# Patient Record
Sex: Male | Born: 1970 | Race: White | Hispanic: No | Marital: Single | Smoking: Never smoker
Health system: Southern US, Community
[De-identification: ages and names within clinical notes are randomized; demographics above are authoritative.]

## PROBLEM LIST (undated history)

## (undated) DIAGNOSIS — F429 Obsessive-compulsive disorder, unspecified: Secondary | ICD-10-CM

## (undated) DIAGNOSIS — R12 Heartburn: Secondary | ICD-10-CM

---

## 2014-12-04 ENCOUNTER — Emergency Department (HOSPITAL_COMMUNITY)
Admission: EM | Admit: 2014-12-04 | Discharge: 2014-12-05 | Disposition: A | Discharge status: Home / Self Care | Payer: 59 | Attending: Emergency Medicine | Admitting: Emergency Medicine

## 2014-12-04 ENCOUNTER — Encounter (HOSPITAL_COMMUNITY): Payer: Self-pay | Admitting: Emergency Medicine

## 2014-12-04 DIAGNOSIS — S80212A Abrasion, left knee, initial encounter: Secondary | ICD-10-CM | POA: Insufficient documentation

## 2014-12-04 DIAGNOSIS — S52102A Unspecified fracture of upper end of left radius, initial encounter for closed fracture: Secondary | ICD-10-CM | POA: Insufficient documentation

## 2014-12-04 DIAGNOSIS — Y9351 Activity, roller skating (inline) and skateboarding: Secondary | ICD-10-CM | POA: Diagnosis not present

## 2014-12-04 DIAGNOSIS — S59902A Unspecified injury of left elbow, initial encounter: Secondary | ICD-10-CM | POA: Diagnosis present

## 2014-12-04 DIAGNOSIS — Y998 Other external cause status: Secondary | ICD-10-CM | POA: Insufficient documentation

## 2014-12-04 DIAGNOSIS — Z8659 Personal history of other mental and behavioral disorders: Secondary | ICD-10-CM | POA: Insufficient documentation

## 2014-12-04 DIAGNOSIS — Y9289 Other specified places as the place of occurrence of the external cause: Secondary | ICD-10-CM | POA: Diagnosis not present

## 2014-12-04 DIAGNOSIS — S4990XA Unspecified injury of shoulder and upper arm, unspecified arm, initial encounter: Secondary | ICD-10-CM

## 2014-12-04 DIAGNOSIS — S52132A Displaced fracture of neck of left radius, initial encounter for closed fracture: Secondary | ICD-10-CM

## 2014-12-04 DIAGNOSIS — T148XXA Other injury of unspecified body region, initial encounter: Secondary | ICD-10-CM

## 2014-12-04 HISTORY — DX: Heartburn: R12

## 2014-12-04 HISTORY — DX: Obsessive-compulsive disorder, unspecified: F42.9

## 2014-12-04 NOTE — ED Notes (Signed)
The patient said he was riding a [employer] and he fell on his arm left arm.  He also fell on his right knee.  He says irt does not hurt if he is not moving it.  He rates his pain 8/10 if he moves his arm.  He does have an abrasion to the right knee.  He is here to be evaluated.

## 2014-12-04 NOTE — ED Provider Notes (Signed)
CSN: [id number]     Arrival date & time 12/04/14  2308 History  This chart was scribed for non-physician practitioner, Arman Filter, NP, working with Samuel Jester, DO, by Roxy Cedar ED Scribe. This patient was seen in room A07C/A07C and the patient's care was started at 11:56 PM    Chief Complaint  Patient presents with  . Arm Injury    The patinet said he was riding a skate board and he fell on his arm left arm.  He also fell on his right knee.  He says irt does not hurt if he is not moving it.   Patient is a 44 y.o. male presenting with arm injury. The history is provided by the patient. No language interpreter was used.  Arm Injury  HPI Comments: Jared Cannon is a 44 y.o. male with a PMHx of OCD, who presents to the Emergency Department complaining of moderate left arm pain due to fall while skateboarding about 3 hours ago. He states that he lost his balance and tried catching his fall with his left arm. His pain is worst at left elbow and radiates to forearm. He reports increased pain with rotation of elbow. He denies LOC or head impact. He also reports associated abrasion to right knee. Patient is unsure of most recent tetanus shot. No medications taken PTA. Patient drove himself to ED. Patient has no known medical allergies.  Past Medical History  Diagnosis Date  . OCD (obsessive compulsive disorder)   . Heartburn    History reviewed. No pertinent past surgical history. History reviewed. No pertinent family history. History  Substance Use Topics  . Smoking status: Never Smoker   . Smokeless tobacco: Never Used  . Alcohol Use: Yes     Comment: occ   Review of Systems  Musculoskeletal: Positive for joint swelling and arthralgias (left elbow). Myalgias: left forearm.  Skin: Positive for wound (abrasion to left knee).  Neurological: Negative for weakness and numbness.  All other systems reviewed and are negative.  Allergies  Review of patient's allergies indicates no  known allergies.  Home Medications   Prior to Admission medications   Medication Sig Start Date End Date Taking? Authorizing Provider  oxyCODONE-acetaminophen (PERCOCET/ROXICET) 5-325 MG per tablet Take 1-2 tablets by mouth every 6 (six) hours as needed for severe pain. 12/05/14   Earley Favor, NP   Triage Vitals: BP 146/86 mmHg  Pulse 73  Temp(Src) 98.2 F (36.8 C) (Oral)  Resp 16  Ht 5\' 10"  (1.778 m)  Wt 200 lb (90.719 kg)  BMI 28.70 kg/m2  SpO2 100%  Physical Exam  Constitutional: He is oriented to person, place, and time. He appears well-developed and well-nourished. No distress.  HENT:  Head: Normocephalic and atraumatic.  Eyes: Conjunctivae and EOM are normal.  Neck: Neck supple. No tracheal deviation present.  Cardiovascular: Normal rate.   Pulmonary/Chest: Effort normal. No respiratory distress.  Musculoskeletal: Normal range of motion. He exhibits tenderness.  TTP to left elbow  Neurological: He is alert and oriented to person, place, and time.  Skin: Skin is warm and dry.  Psychiatric: He has a normal mood and affect. His behavior is normal.  Nursing note and vitals reviewed.  ED Course  Procedures (including critical care time)  DIAGNOSTIC STUDIES: Oxygen Saturation is 100% on RA, normal by my interpretation.    COORDINATION OF CARE: 12:05 AM- Discussed plans to order diagnostic imaging of left elbow. Will give patient medication for pain management. Pt advised of plan for  treatment and pt agrees.  Labs Review Labs Reviewed - No data to display  Imaging Review Dg Elbow Complete Left  12/05/2014   CLINICAL DATA:  Skateboard accident, fall.  EXAM: LEFT ELBOW - COMPLETE 3+ VIEW  COMPARISON:  None.  FINDINGS: There is no evidence of fracture, dislocation, or joint effusion. Slight cortical irregularity of the radial head neck junction seen on a single view. There is no evidence of arthropathy or other focal bone abnormality. Small posterior joint effusion.   IMPRESSION: Slight cortical irregularity of the radial head neck junction and small elbow effusion suspicious for acute injury. Recommend follow-up radiograph in 1 week.   Electronically Signed   By: Awilda Metro M.D.   On: 12/05/2014 03:31   Dg Wrist Complete Left  12/05/2014   CLINICAL DATA:  Skateboard accident, fall.  EXAM: LEFT WRIST - COMPLETE 3+ VIEW  COMPARISON:  None.  FINDINGS: There is no evidence of fracture or dislocation. There is no evidence of arthropathy or other focal bone abnormality. Soft tissues are unremarkable.  IMPRESSION: Negative.   Electronically Signed   By: Awilda Metro M.D.   On: 12/05/2014 03:32     EKG Interpretation None    placed in sling and given ortho follow up and Rx for Percocet  MDM   Final diagnoses:  Abrasion  Radial neck fracture, left, closed, initial encounter   I personally performed the services described in this documentation, which was scribed in my presence. The recorded information has been reviewed and is accurate.  Earley Favor, NP 12/05/14 0341  Samuel Jester, DO 12/08/14 1427

## 2014-12-05 ENCOUNTER — Emergency Department (HOSPITAL_COMMUNITY): Payer: 59

## 2014-12-05 MED ORDER — IBUPROFEN 200 MG PO TABS
600.0000 mg | ORAL_TABLET | Freq: Once | ORAL | Status: DC
  Filled 2014-12-05: qty 3

## 2014-12-05 MED ORDER — HYDROCODONE-ACETAMINOPHEN 5-325 MG PO TABS
1.0000 | ORAL_TABLET | Freq: Once | ORAL | Status: AC
  Administered 2014-12-05: 1 via ORAL
  Filled 2014-12-05: qty 1

## 2014-12-05 MED ORDER — OXYCODONE-ACETAMINOPHEN 5-325 MG PO TABS: 1.0000 | ORAL_TABLET | Freq: Four times a day (QID) | ORAL | Status: DC | PRN

## 2014-12-05 MED ORDER — MORPHINE SULFATE 4 MG/ML IJ SOLN
4.0000 mg | Freq: Once | INTRAMUSCULAR | Status: AC
  Administered 2014-12-05: 4 mg via INTRAMUSCULAR
  Filled 2014-12-05: qty 1

## 2014-12-05 NOTE — ED Notes (Signed)
Patient transported to X-ray 

## 2014-12-05 NOTE — ED Notes (Signed)
Xray called to say pt unable to tolerate xray, returned to room

## 2014-12-05 NOTE — ED Notes (Signed)
Jared Cannon from xray notified that pt is ready for transport.

## 2014-12-05 NOTE — Discharge Instructions (Signed)
Wear the sling at alltimes except bathing until seen by orthopedics The xray show an irregularity at the radial head consistent with an acute fracture but furthe rxrays are recommended in 1 week

## 2015-10-22 IMAGING — DX DG ELBOW COMPLETE 3+V*L*
3 series · 3 of 3 positions shown · non-contrast
Comparison: None.

CLINICAL DATA: Skateboard accident, fall.

EXAM:
LEFT ELBOW - COMPLETE 3+ VIEW

[elbow ap (1 of 2)]
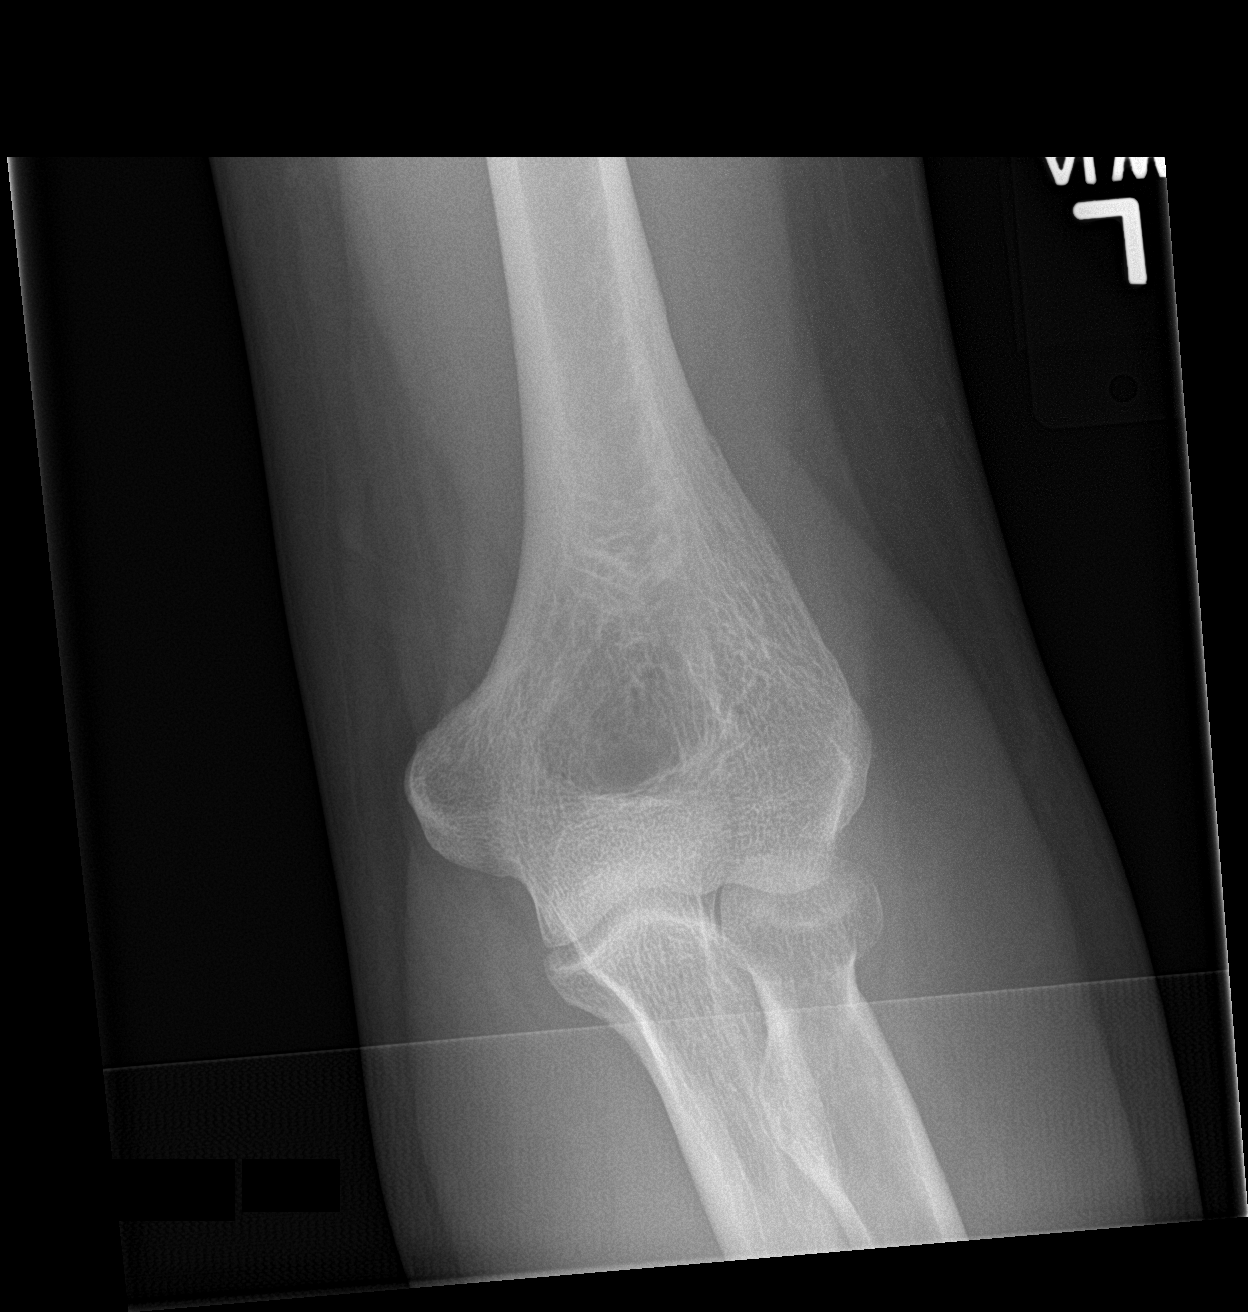

[elbow lat]
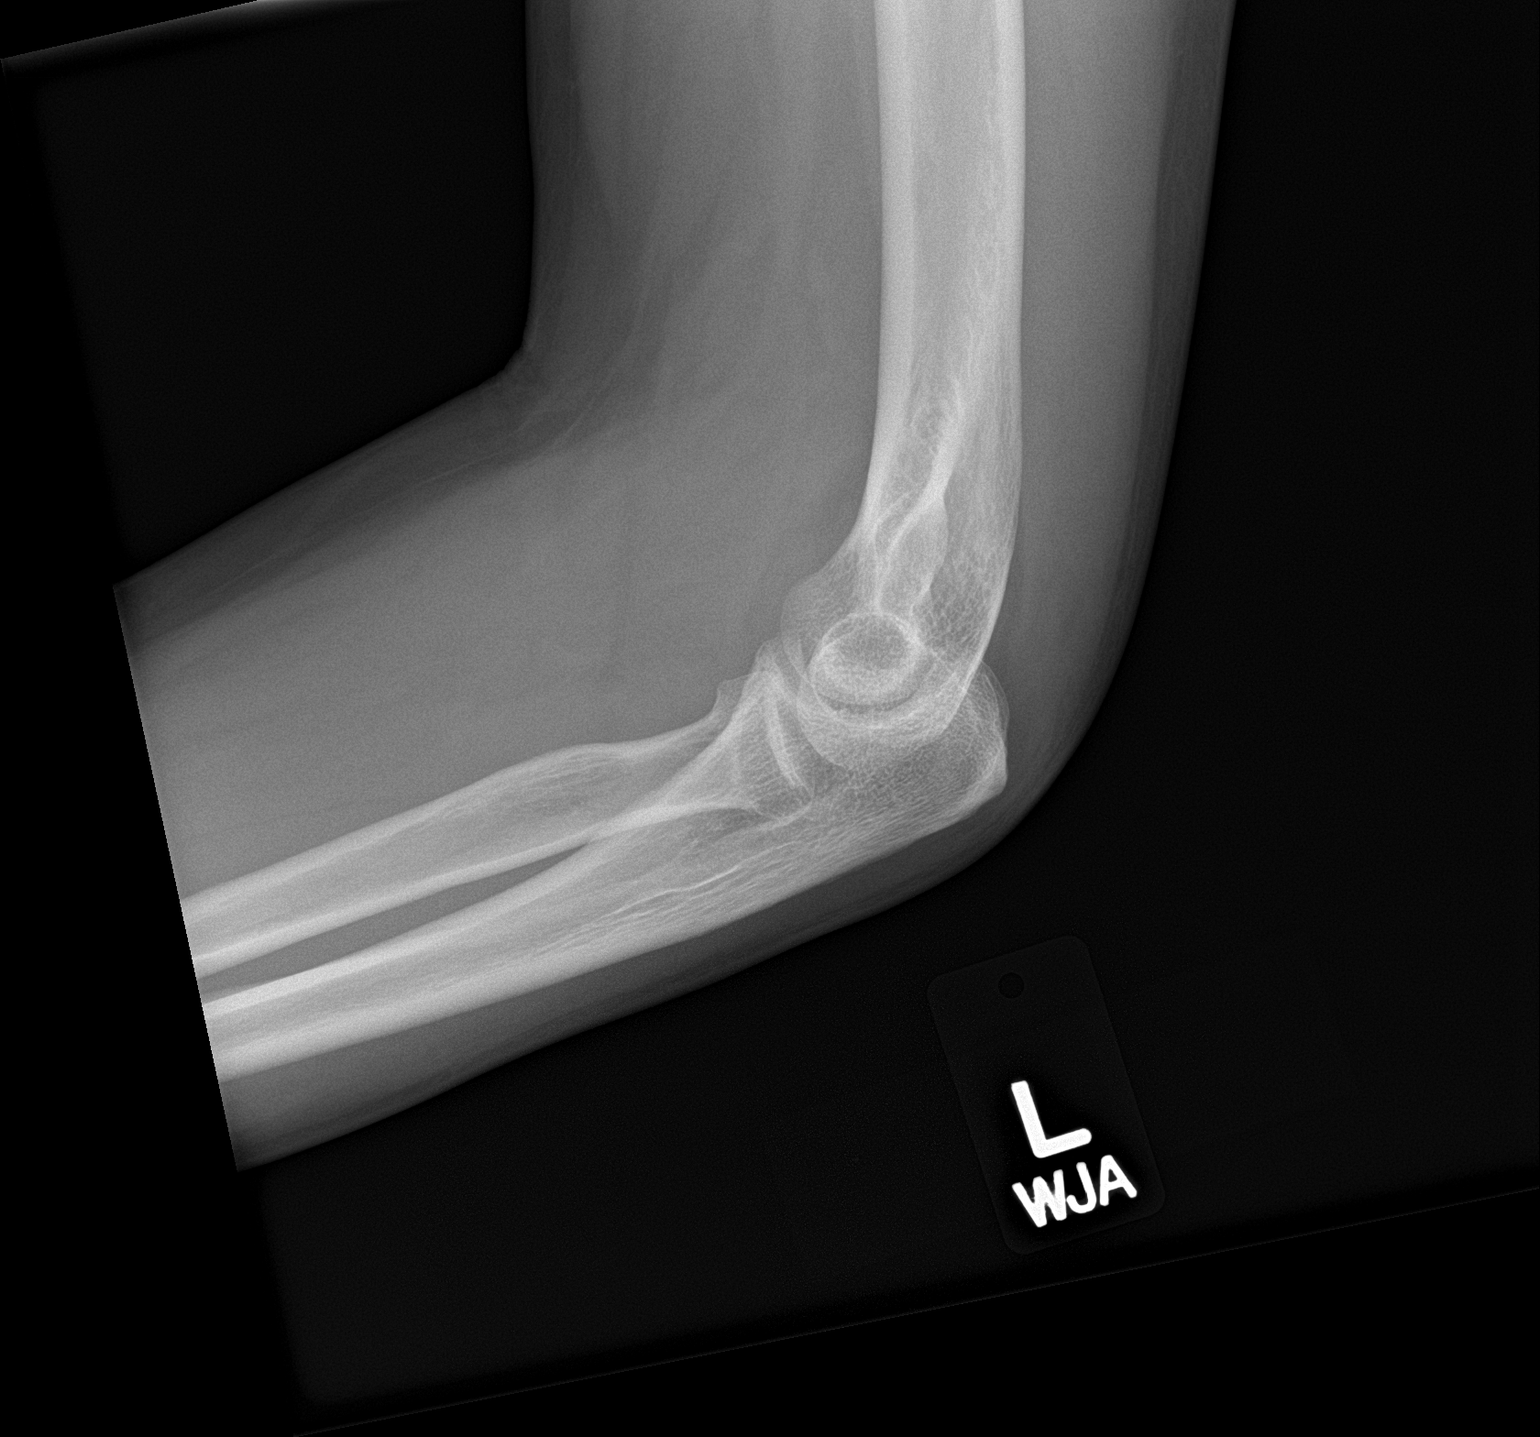

[elbow ap (2 of 2)]
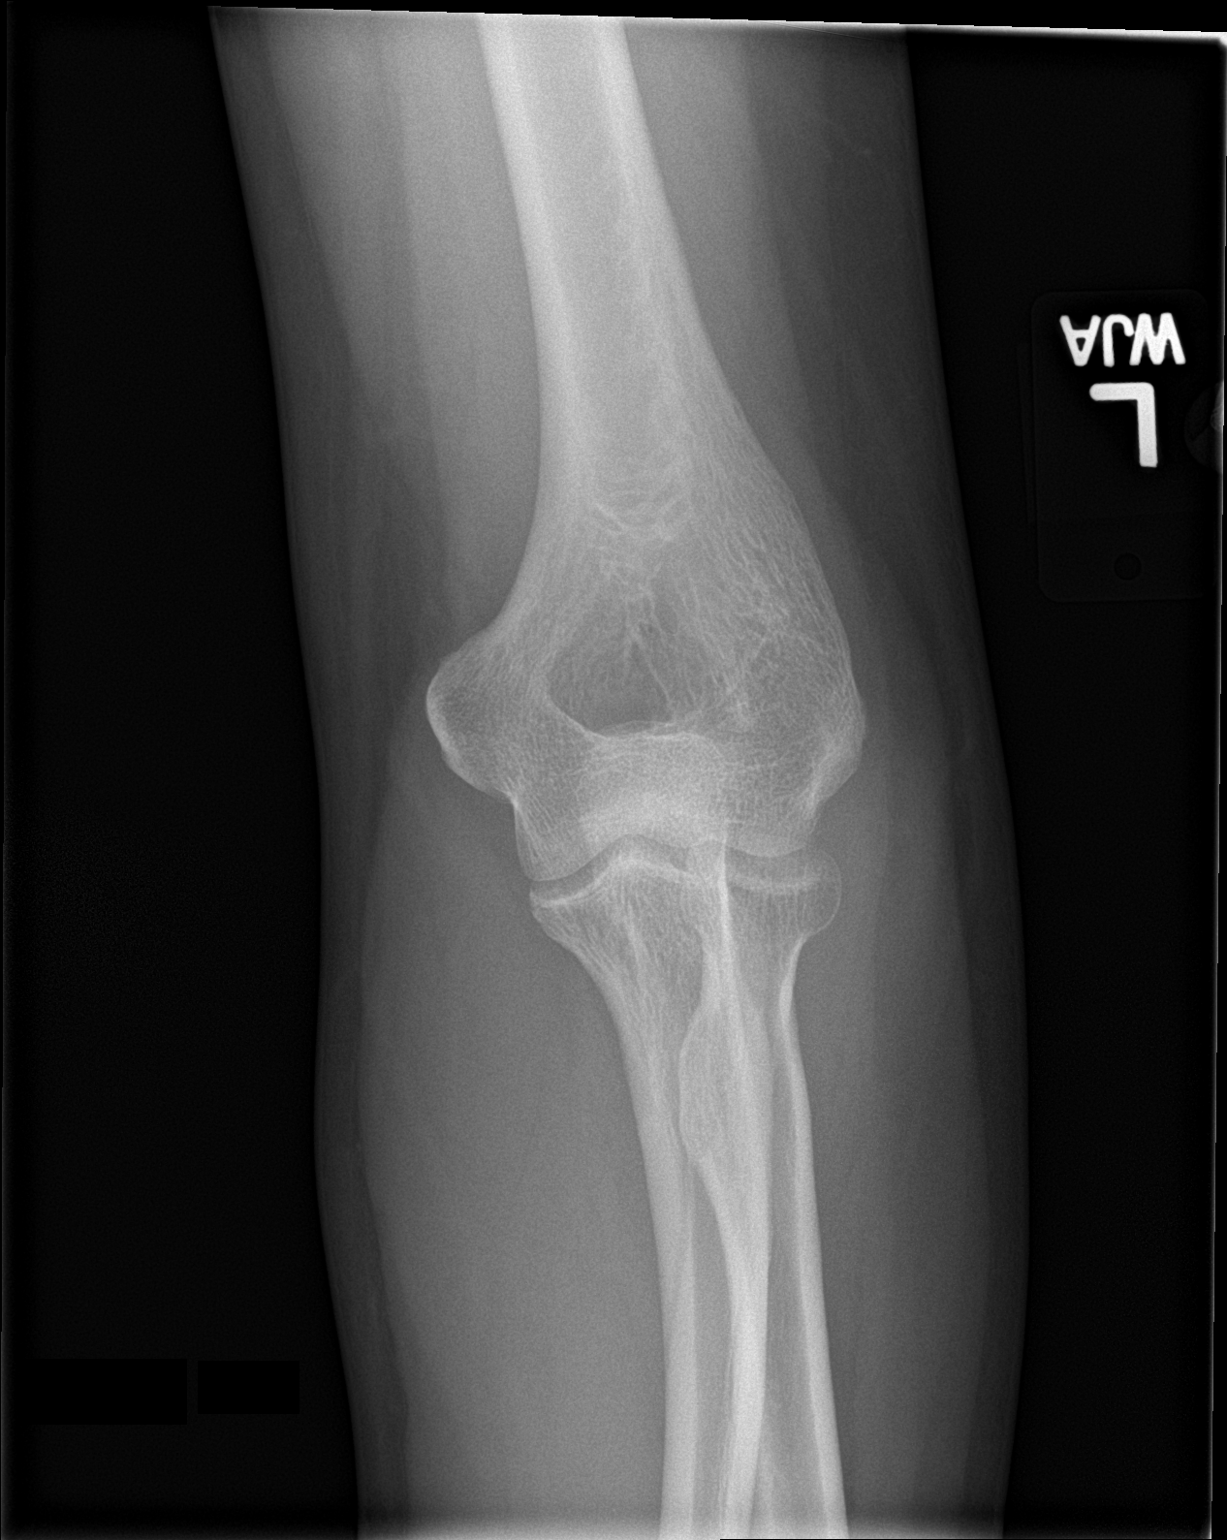

[3 of 3 positions shown; findings below may reference images not displayed]

FINDINGS: There is no evidence of fracture, dislocation, or joint effusion.
Slight cortical irregularity of the radial head neck junction seen
on a single view. There is no evidence of arthropathy or other focal
bone abnormality. Small posterior joint effusion.
IMPRESSION: Slight cortical irregularity of the radial head neck junction and
small elbow effusion suspicious for acute injury. Recommend
follow-up radiograph in 1 week.

## 2020-07-07 ENCOUNTER — Ambulatory Visit (HOSPITAL_COMMUNITY)
Admission: RE | Admit: 2020-07-07 | Discharge: 2020-07-07 | Disposition: A | Discharge status: Home / Self Care | Payer: 59 | Attending: Psychiatry | Admitting: Psychiatry

## 2020-07-07 DIAGNOSIS — F419 Anxiety disorder, unspecified: Secondary | ICD-10-CM | POA: Diagnosis not present

## 2020-07-07 DIAGNOSIS — F341 Dysthymic disorder: Secondary | ICD-10-CM | POA: Insufficient documentation

## 2020-07-07 DIAGNOSIS — R450 Nervousness: Secondary | ICD-10-CM | POA: Diagnosis not present

## 2020-07-07 DIAGNOSIS — F429 Obsessive-compulsive disorder, unspecified: Secondary | ICD-10-CM | POA: Insufficient documentation

## 2020-07-07 NOTE — BH Assessment (Signed)
Pt consented for clinician to contact his mother Jeanene Erb, [phone]) to gather additional information. Pt's mother reported, the pt has been belligerent, scary lately she thinks it's his medications. Pt's mother reported, is more angry. Pt's mother reported, pt said he's going to kill himself but she does not have any concerns of suicide. Pt's mother also denies concerns of HI, self-injurious behaviors and access to weapons with the pt. Pt's mother reported, she feel the pt will be safe at home.    Redmond Pulling, MS, [facility], [facility] Triage Specialist [phone]

## 2020-07-07 NOTE — BH Assessment (Signed)
Comprehensive Clinical Assessment (CCA) Note  07/07/2020 Jared Cannon [id number]   Jared Cannon is a 50 year old male who presents voluntary and unaccompanied to [facility]. Clinician asked the pt, "what brought you to the hospital?" Pt reported, last night he made him a cup of coffee, he mother asked him to do several things, he got up set and pushed his cup, sugar and another object in the sink, causing him to cup a chip. Pt reported, his mother called his brother and asked if she called the police, while in front of the pt. Per pt, his brother told their mother she should call the police, the police were not called. Pt reported, they(he and his mother) got in a very defensive argument. Pt reported, he's been living with his mother for four years. Pt reported, he doesn't want to stay with his mother, he's looked into getting an apartment but if he wants to go to [facility] he will need to stay. Pt reported, he has been on a LOA from work for 2-3 months due to his depression and OCD. Pt reported, he went back to work for 4-5 days but went back on short term leave. Pt discussed rituals and obsessions associated with his OCD. Pt reported, he's been sleeping more to cope. Pt reported, previous passive suicidal thoughts. Pt reported, two weeks ago he told his psychiatrist he was suicidal with a plan. Pt reported, he has plans, things he wants to do and he does not want to kill himself. Pt also denies, SI, HI, AVH, and access to weapons.   Pt denies, substance use. Pt is linked to Dr. Donell Beers for medication management and to [facility] for therapy. Pt reported, previous inpatient admission in [place], [place]. Pt reported, he is linked to Intensive Outpatient Therapy Program with [facility] by his psychiatrist but was waiting until he get his new insurance. Pt is planning to go [facility] in  for his OCD.   Pt presents alert with normal speech. Pt's mood, affect is anxious. Pt's  thought content was appropriate to mood and circumstances. Pt reported, if discharged from [facility] he can contract for safety.   Disposition: Jared Sequin, NP recommends the pt does not meet inpatient treatment criteria, pt to follow up with Intensive Outpatient Treatment Program. Pt to follow up with Dr. Donell Beers (psychiatrist).   Diagnosis: OCD  Chief Complaint:  Chief Complaint  Patient presents with  . Psychiatric Evaluation   Visit Diagnosis:     CCA Screening, Triage and Referral (STR)  Patient Reported Information How did you hear about [place]? Self  Referral name: No data recorded Referral phone number: No data recorded  Whom do you see for routine medical problems? No data recorded Practice/Facility Name: No data recorded Practice/Facility Phone Number: No data recorded Name of Contact: No data recorded Contact Number: No data recorded Contact Fax Number: No data recorded Prescriber Name: No data recorded Prescriber Address (if known): No data recorded  What Is the Reason for Your Visit/Call Today? No data recorded How Long Has This Been Causing You Problems? > than 6 months  What Do You Feel Would Help You the Most Today? No data recorded  Have You Recently Been in Any Inpatient Treatment (Hospital/Detox/Crisis Center/28-Day Program)? No  Name/Location of Program/Hospital:No data recorded How Long Were You There? No data recorded When Were You Discharged? No data recorded  Have You Ever Received Services From [facility] Before? Yes  Who Do You See  at [facility]? Dr. Donell Beers, psychiatrist.   Have You Recently Had Any Thoughts About Hurting Yourself? No  Are You Planning to Commit Suicide/Harm Yourself At This time? No   Have you Recently Had Thoughts About Hurting Someone Karolee Ohs? No  Explanation: No data recorded  Have You Used Any Alcohol or Drugs in the Past 24 Hours? No  How Long Ago Did You Use Drugs or Alcohol? No data recorded What Did You Use and  How Much? No data recorded  Do You Currently Have a Therapist/Psychiatrist? No  Name of Therapist/Psychiatrist: No data recorded  Have You Been Recently Discharged From Any Office Practice or Programs? No data recorded Explanation of Discharge From Practice/Program: No data recorded    CCA Screening Triage Referral Assessment Type of Contact: Face-to-Face  Is this Initial or Reassessment? No data recorded Date Telepsych consult ordered in [facility]:  No data recorded Time Telepsych consult ordered in [facility]:  No data recorded  Patient Reported Information Reviewed? Yes  Patient Left Without Being Seen? No data recorded Reason for Not Completing Assessment: No data recorded  Collateral Involvement: Bonne Dolores, mother, [phone]   Does Patient Have a Court Appointed Legal Guardian? No data recorded Name and Contact of Legal Guardian: No data recorded If Minor and Not Living with Parent(s), Who has Custody? No data recorded Is CPS involved or ever been involved? No data recorded Is APS involved or ever been involved? No data recorded  Patient Determined To Be At Risk for Harm To Self or Others Based on Review of Patient Reported Information or Presenting Complaint? No  Method: No data recorded Availability of Means: No data recorded Intent: No data recorded Notification Required: No data recorded Additional Information for Danger to Others Potential: No data recorded Additional Comments for Danger to Others Potential: No data recorded Are There Guns or Other Weapons in Your Home? No data recorded Types of Guns/Weapons: No data recorded Are These Weapons Safely Secured?                            No data recorded Who Could Verify You Are Able To Have These Secured: No data recorded Do You Have any Outstanding Charges, Pending Court Dates, Parole/Probation? No data recorded Contacted To Inform of Risk of Harm To Self or Others: No data recorded  Location of Assessment: -- ([facility] [address]   Does Patient Present under Involuntary Commitment? No  IVC Papers Initial File Date: No data recorded  [place] of Residence: [place]   Patient Currently Receiving the Following Services: Individual Therapy; Medication Management   Determination of Need: Routine (7 days)   Options For Referral: Intensive Outpatient Therapy     CCA Biopsychosocial Intake/Chief Complaint:  Depression, anxiety symptoms, OCD, previous passive SI, conflict with mother, living conditions, on a LOA from work.  Current Symptoms/Problems: Depression, anxiety symptoms, OCD, previous passive SI, conflict with mother, living conditions, on a LOA from work.   Patient Reported Schizophrenia/Schizoaffective Diagnosis in Past: No data recorded  Strengths: Not assessed.  Preferences: Not assessed.  Abilities: Not assessed.   Type of Services Patient Feels are Needed: Not assessed.   Initial Clinical Notes/Concerns: Not assessed.   Mental Health Symptoms Depression:  Irritability; Sleep (too much or little); Fatigue; Difficulty Concentrating; Change in energy/activity   Duration of Depressive symptoms: No data recorded  Mania:  Racing thoughts   Anxiety:   Worrying (Panic attacks, sweating.)   Psychosis:  None  Duration of Psychotic symptoms: No data recorded  Trauma:  None   Obsessions:  Recurrent & persistent thoughts/impulses/images; Cause anxiety   Compulsions:  Intended to reduce stress or prevent another outcome; Repeated behaviors/mental acts   Inattention:  None   Hyperactivity/Impulsivity:  N/A   Oppositional/Defiant Behaviors:  None   Emotional Irregularity:  No data recorded  Other Mood/Personality Symptoms:  No data recorded   Mental Status Exam Appearance and self-care  Stature:  Average   Weight:  Average weight   Clothing:  Casual   Grooming:  Normal   Cosmetic use:  None   Posture/gait:  No data recorded  Motor activity:  Not Remarkable    Sensorium  Attention:  Normal   Concentration:  Normal   Orientation:  X5   Recall/memory:  Normal   Affect and Mood  Affect:  Anxious   Mood:  Anxious   Relating  Eye contact:  Normal   Facial expression:  Anxious   Attitude toward examiner:  Cooperative   Thought and Language  Speech flow: Normal   Thought content:  Appropriate to Mood and Circumstances   Preoccupation:  Other (Comment) (Anxiety, OCD.)   Hallucinations:  None   Organization:  No data recorded  Affiliated Computer ServicesExecutive Functions  Fund of Knowledge:  Good   Intelligence:  Average   Abstraction:  No data recorded  Judgement:  Good   Reality Testing:  No data recorded  Insight:  Good   Decision Making:  No data recorded  Social Functioning  Social Maturity:  No data recorded  Social Judgement:  No data recorded  Stress  Stressors:  Housing; Other (Comment) (OCD, anxiety, depression/sadness, living situation, on LOA at work.)   Coping Ability:  Human resources officerverwhelmed   Skill Deficits:  No data recorded  Supports:  Family     Religion: Religion/Spirituality Are You A Religious Person?: Yes What is Your Religious Affiliation?: Christian  Leisure/Recreation: Leisure / Recreation Do You Have Hobbies?: No  Exercise/Diet: Exercise/Diet Do You Exercise?: No Do You Follow a Special Diet?: No Do You Have Any Trouble Sleeping?: Yes Explanation of Sleeping Difficulties: Pt reported, sleeping all the time to cope.   CCA Employment/Education Employment/Work Situation: Employment / Work Situation Employment situation: Leave of absence (P has been on a LOA for 2-3 months from his job at [employer].)  Education: Education Is Patient Currently Attending School?: No Last Grade Completed: 12 Did Garment/textile technologistYou Graduate From [employer]?: Yes Did Theme park managerYou Attend College?: Yes What Type of College Degree Do you Have?: Sales promotion account executiveBachelor's of Science  Degree in at [employer] degree in Arts at [employer].   CCA  Family/Childhood History Family and Relationship History: Family history Marital status: Single What is your sexual orientation?: Not assessed. Has your sexual activity been affected by drugs, alcohol, medication, or emotional stress?: Not assessed. Does patient have children?: No  Childhood History:  Childhood History Additional childhood history information: Not assessed. Description of patient's relationship with caregiver when they were a child: Not assessed. Patient's description of current relationship with people who raised him/her: Not assessed. How were you disciplined when you got in trouble as a child/adolescent?: Not assessed. Does patient have siblings?: Yes Number of Siblings: 2 Did patient suffer any verbal/emotional/physical/sexual abuse as a child?: No Has patient ever been sexually abused/assaulted/raped as an adolescent or adult?: No Witnessed domestic violence?: No Has patient been affected by domestic violence as an adult?:  (NA)  Child/Adolescent Assessment:     CCA Substance Use Alcohol/Drug  Use: Alcohol / Drug Use Pain Medications: See MAR Prescriptions: See MAR Over the Counter: See MAR History of alcohol / drug use?: No history of alcohol / drug abuse (Pt denies, use.)     ASAM's:  Six Dimensions of Multidimensional Assessment  Dimension 1:  Acute Intoxication and/or Withdrawal Potential:      Dimension 2:  Biomedical Conditions and Complications:      Dimension 3:  Emotional, Behavioral, or Cognitive Conditions and Complications:     Dimension 4:  Readiness to Change:     Dimension 5:  Relapse, Continued use, or Continued Problem Potential:     Dimension 6:  Recovery/Living Environment:     ASAM Severity Score:    ASAM Recommended Level of Treatment:     Substance use Disorder (SUD)    Recommendations for Services/Supports/Treatments: Recommendations for Services/Supports/Treatments Recommendations For Services/Supports/Treatments:  Medication Management,Individual Therapy,IOP (Intensive Outpatient Program)  DSM5 Diagnoses: There are no problems to display for this patient.   Referrals to Alternative Service(s): Referred to Alternative Service(s):   Place:   Date:   Time:    Referred to Alternative Service(s):   Place:   Date:   Time:    Referred to Alternative Service(s):   Place:   Date:   Time:    Referred to Alternative Service(s):   Place:   Date:   Time:     Redmond Pullingreylese D Kannon Granderson, [facility]  Comprehensive Clinical Assessment (CCA) Screening, Triage and Referral Note  07/07/2020 Jared Cannon [id number]  Chief Complaint:  Chief Complaint  Patient presents with  . Psychiatric Evaluation   Visit Diagnosis:   Patient Reported Information How did you hear about us? Self   Referral name: No data recorded  Referral phone number: No data recorded Whom do you see for routine medical problems? No data recorded  Practice/Facility Name: No data recorded  Practice/Facility Phone Number: No data recorded  Name of Contact: No data recorded  Contact Number: No data recorded  Contact Fax Number: No data recorded  Prescriber Name: No data recorded  Prescriber Address (if known): No data recorded What Is the Reason for Your Visit/Call Today? No data recorded How Long Has This Been Causing You Problems? > than 6 months  Have You Recently Been in Any Inpatient Treatment (Hospital/Detox/Crisis Center/28-Day Program)? No   Name/Location of Program/Hospital:No data recorded  How Long Were You There? No data recorded  When Were You Discharged? No data recorded [facility]? Yes   [facility] Dr. Donell BeersPlovsky, psychiatrist.  Have You Recently Had Any Thoughts About Hurting Yourself? No   Are You Planning to Commit Suicide/Harm Yourself At This time?  No  Have you Recently Had Thoughts About Hurting Someone Karolee Ohslse? No   Explanation: No data recorded Have You Used Any  Alcohol or Drugs in the Past 24 Hours? No   How Long Ago Did You Use Drugs or Alcohol?  No data recorded  What Did You Use and How Much? No data recorded What Do You Feel Would Help You the Most Today? No data recorded Do You Currently Have a Therapist/Psychiatrist? No   Name of Therapist/Psychiatrist: No data recorded  Have You Been Recently Discharged From Any Office Practice or Programs? No data recorded  Explanation of Discharge From Practice/Program:  No data recorded    CCA Screening Triage Referral Assessment Type of Contact: Face-to-Face   Is this Initial or Reassessment? No data recorded  Date  Telepsych consult ordered in [facility]:  No data recorded  Time Telepsych consult ordered in [facility]:  No data recorded Patient Reported Information Reviewed? Yes   Patient Left Without Being Seen? No data recorded  Reason for Not Completing Assessment: No data recorded Collateral Involvement: Bonne Dolores, mother, [phone]  Does Patient Have a Court Appointed Legal Guardian? No data recorded  Name and Contact of Legal Guardian:  No data recorded If Minor and Not Living with Parent(s), Who has Custody? No data recorded Is CPS involved or ever been involved? No data recorded Is APS involved or ever been involved? No data recorded Patient Determined To Be At Risk for Harm To Self or Others Based on Review of Patient Reported Information or Presenting Complaint? No   Method: No data recorded  Availability of Means: No data recorded  Intent: No data recorded  Notification Required: No data recorded  Additional Information for Danger to Others Potential:  No data recorded  Additional Comments for Danger to Others Potential:  No data recorded  Are There Guns or Other Weapons in Your Home?  No data recorded   Types of Guns/Weapons: No data recorded   Are These Weapons Safely Secured?                              No data recorded   Who Could Verify You Are Able To Have These Secured:    No  data recorded Do You Have any Outstanding Charges, Pending Court Dates, Parole/Probation? No data recorded Contacted To Inform of Risk of Harm To Self or Others: No data recorded Location of Assessment: -- ([facility]  Does Patient Present under Involuntary Commitment? No   IVC Papers Initial File Date: No data recorded  [place] of Residence: [place]  Patient Currently Receiving the Following Services: Individual Therapy; Medication Management   Determination of Need: Routine (7 days)   Options For Referral: Intensive Outpatient Therapy   Redmond Pulling, [facility]       Redmond Pulling, MS, [facility], [facility] Triage Specialist [phone]

## 2020-07-07 NOTE — H&P (Signed)
Behavioral Health Medical Screening Exam  Jared Cannon is an 50 y.o. male with history of depression and OCD, seen outpatient by Dr. Donell Beers. He is also seen for [facility] by [facility]. He is pleasant but anxious on assessment. He reports increased depression and OCD symptoms recently and has taken a short term leave from work. He is working on getting into the [facility] treatment center for OCD but reports Dr. Donell Beers had recommended PHP in the meantime. Dr. Donell Beers also recently wrote a prescription for Paxil, which patient states he picked up today but has not started taking yet. He reports delaying PHP because he was unsure if his new insurance would cover it. He is unsure when his insurance will change but states he currently has insurance that covers PHP. He strongly denies any SI/HI/AVH. He shows no signs of responding to internal stimuli.  Total Time spent with patient: 20 minutes  Psychiatric Specialty Exam: Physical Exam Vitals reviewed.  Constitutional:      Appearance: He is well-developed and well-nourished.  Pulmonary:     Effort: Pulmonary effort is normal.  Neurological:     Mental Status: He is alert and oriented to person, place, and time.    Review of Systems  Constitutional: Negative.   Respiratory: Negative for cough and shortness of breath.   Psychiatric/Behavioral: Positive for dysphoric mood. Negative for agitation, behavioral problems, confusion, decreased concentration, hallucinations, self-injury and suicidal ideas. The patient is nervous/anxious. The patient is not hyperactive.    There were no vitals taken for this visit.There is no height or weight on file to calculate BMI. General Appearance: Casual Eye Contact:  Good Speech:  Normal Rate Volume:  Normal Mood:  Anxious Affect:  Congruent Thought Process:  Coherent and Descriptions of Associations: Circumstantial Orientation:  Full (Time, Place, and Person) Thought Content:  Rumination Suicidal  Thoughts:  No Homicidal Thoughts:  No Memory:  Immediate;   Good Recent;   Good Remote;   Good Judgement:  Fair Insight:  Fair Psychomotor Activity:  Increased Concentration: Concentration: Fair and Attention Span: Fair Recall:  Good Fund of Knowledge:Fair Language: Good Akathisia:  No Handed:  Right AIMS (if indicated):    Assets:  Communication Skills Desire for Improvement Financial Resources/Insurance Housing Leisure Time Resilience Sleep:     Musculoskeletal: Strength & Muscle Tone: within normal limits Gait & Station: normal Patient leans: N/A  There were no vitals taken for this visit.  Recommendations: Based on my evaluation the patient does not appear to have an emergency medical condition.  Recommend PHP with patient's current insurance.  Aldean Baker, NP 07/07/2020, 7:51 PM

## 2020-07-13 ENCOUNTER — Other Ambulatory Visit (HOSPITAL_COMMUNITY): Payer: 59

## 2020-07-13 ENCOUNTER — Other Ambulatory Visit: Payer: Self-pay

## 2020-07-19 ENCOUNTER — Other Ambulatory Visit (HOSPITAL_COMMUNITY): Payer: 59 | Attending: Psychiatry | Admitting: Licensed Clinical Social Worker

## 2020-07-19 ENCOUNTER — Other Ambulatory Visit: Payer: Self-pay

## 2020-07-19 DIAGNOSIS — R41844 Frontal lobe and executive function deficit: Secondary | ICD-10-CM | POA: Insufficient documentation

## 2020-07-19 DIAGNOSIS — F429 Obsessive-compulsive disorder, unspecified: Secondary | ICD-10-CM

## 2020-07-19 DIAGNOSIS — F332 Major depressive disorder, recurrent severe without psychotic features: Secondary | ICD-10-CM | POA: Insufficient documentation

## 2020-07-19 DIAGNOSIS — F428 Other obsessive-compulsive disorder: Secondary | ICD-10-CM | POA: Insufficient documentation

## 2020-07-19 DIAGNOSIS — F331 Major depressive disorder, recurrent, moderate: Secondary | ICD-10-CM

## 2020-07-19 DIAGNOSIS — R4589 Other symptoms and signs involving emotional state: Secondary | ICD-10-CM | POA: Insufficient documentation

## 2020-07-20 ENCOUNTER — Encounter (HOSPITAL_COMMUNITY): Payer: Self-pay

## 2020-07-20 ENCOUNTER — Other Ambulatory Visit (HOSPITAL_COMMUNITY): Payer: 59 | Admitting: Occupational Therapy

## 2020-07-20 ENCOUNTER — Other Ambulatory Visit (HOSPITAL_COMMUNITY): Payer: 59 | Admitting: Licensed Clinical Social Worker

## 2020-07-20 DIAGNOSIS — F428 Other obsessive-compulsive disorder: Secondary | ICD-10-CM | POA: Diagnosis not present

## 2020-07-20 DIAGNOSIS — R4589 Other symptoms and signs involving emotional state: Secondary | ICD-10-CM | POA: Diagnosis present

## 2020-07-20 DIAGNOSIS — F429 Obsessive-compulsive disorder, unspecified: Secondary | ICD-10-CM

## 2020-07-20 DIAGNOSIS — R41844 Frontal lobe and executive function deficit: Secondary | ICD-10-CM | POA: Diagnosis not present

## 2020-07-20 DIAGNOSIS — F332 Major depressive disorder, recurrent severe without psychotic features: Secondary | ICD-10-CM

## 2020-07-20 NOTE — Therapy (Signed)
[place], [place], [address] Phone: [phone]   Fax:  [phone] Virtual Visit via Video Note  I connected with Jared Cannon on 07/20/20 at  12:00 PM EST by a video enabled telemedicine application and verified that I am speaking with the correct person using two identifiers.  Location: Patient: Patient Home Provider: Clinic Office   I discussed the limitations of evaluation and management by telemedicine and the availability of in person appointments. The patient expressed understanding and agreed to proceed.   I discussed the assessment and treatment plan with the patient. The patient was provided an opportunity to ask questions and all were answered. The patient agreed with the plan and demonstrated an understanding of the instructions.   The patient was advised to call back or seek an in-person evaluation if the symptoms worsen or if the condition fails to improve as anticipated.  I provided 90 minutes of non-face-to-face time during this encounter. 40 minutes OT Evaluation 50 minutes OT [place], OT   Occupational Therapy Evaluation  Patient Details  Name: Jared Cannon MRN: [id number] Date of Birth: 1970/10/29 Referring Provider (OT): Ricky Ala   Encounter Date: 07/20/2020   OT End of Session - 07/20/20 1451    Visit Number 1    Number of Visits 20    Date for OT Re-Evaluation 08/17/20    Authorization Type [employer] AND [employer]    OT Start Time 1200   OT Eval [id number]   OT Stop Time 1250    OT Time Calculation (min) 50 min    Activity Tolerance Patient tolerated treatment well    Behavior During Therapy [facility] for tasks assessed/performed;Restless           Past Medical History:  Diagnosis Date  . Heartburn   . OCD (obsessive compulsive disorder)     History reviewed. No pertinent surgical history.  There were no vitals filed for this visit.    Subjective Assessment - 07/20/20 1450    Currently in Pain? No/denies             [facility] OT Assessment - 07/20/20 0001      Assessment   Medical Diagnosis Obsessive compulsive disorder    Referring Provider (OT) Ricky Ala      Precautions   Precautions None      Balance Screen   Has the patient fallen in the past 6 months No    Has the patient had a decrease in activity level because of a fear of falling?  No    Is the patient reluctant to leave their home because of a fear of falling?  No            OT Education - 07/20/20 1450    Education Details Educated on OT within [facility] programming along with education on different communication styles and identified strategies/tips to practice being more assertive    Person(s) Educated Patient    Methods Explanation;Handout    Comprehension Verbalized understanding            OT Short Term Goals - 07/20/20 1453      OT SHORT TERM GOAL #1   Title Pt will actively engage in OT group sessions throughout duration of PHP programming, in order to promote daily structure, social engagement, and opportunities to develop and utilize adaptive strategies to maximize functional performance in preparation for safe transition and integration back into school, work, and  the community.    Time 4    Period Weeks    Status New    Target Date 08/17/20      OT SHORT TERM GOAL #2   Title Pt will practice and identify 1-3 adaptive coping strategies he can utilize, in order to safely manage increased ruminations and OCD tendencies, with min cues, in preparation for safe and healthy reintegration back into the community at discharge.    Time 4    Period Weeks    Status New    Target Date 08/17/20      OT SHORT TERM GOAL #3   Title Pt will identify 1-3 ways to structure his free time, in order to promote re-engagement in preferred leisure interests and establishment of a daily routine, in preparation for community reintegration.    Time 4     Period Weeks    Status New    Target Date 08/17/20         Occupational Therapy Assessment 07/20/2020  Jared Cannon is a 50 y/o male with PMHx of severe OCD, anxiety, and depression who was referred to the [facility] program at the recommendation of his psychiatrist for reports of worsening OCD tendencies/routines leading to an increase in anxiety/depression and difficulty managing his daily routines. Pt reports several increased psychosocial stressors including being on a leave of absence from work as a result of his mental health, moving back in with his elderly mother, and difficulty managing his ADL/iADLs. Pt reports a desire to engage in [facility] programming in order to manage identified stressors and to engage meaningfully in identified areas of occupation and ADL/iADLs. Upon approach, pt presents as pressured and tangential, often straying from the question asked of him. Pt repetitive in his thoughts and expressions, however was able to recognize this and redirect on his own throughout OT evaluation. Pt identifies goal for admission "Get some of my OCD tendencies in better control so it doesn't affect my life as much as it does now".   Precautions/Limitations: None noted/observed  Cognition: Appears intact   Visual Motor: Pt wears glasses for visual deficit and was wearing them at present during group and OT evaluation.   Living Situation: Pt recently moved back to [place] from [place]. Pt currently living with his Mom, however reports desire to live on his own again or get a roommate in the future.   School/Work: Pt is currently on a LOA from work, on short term and medical disability. 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  ADL/iADL Performance: Pt reports difficulty completing his daily routine at times, as a result of his OCD. Pt reports not showering for 1-2 weeks at a time. Pt also reports difficulty with dressing with reports of OCD habits that get in the way.     Leisure Interests and Hobbies: None noted  Social Support: Identifies support from [place], brother, sister, and individual therapist   What do you do when you are very stressed, angry, upset, sad or anxious? Isolate from others, Yell/Scream, Talk to someone and Sleep   What helps when you are not feeling well? Pt unable to currently identify any coping strategies  What are some things that make it MORE difficult for you when you are already upset? Not having choices/input, Not being able to express my opinion, People staring at me and Being criticized  Is there anything specific that you would like help with while you're in the partial hospitalization program? Coping Skills, Anger Management,  Communication, Stress Management, Self-Care, Goal-setting and Exercise  What is your goal while you are here?  "Get some of my OCD tendencies in better control so it doesn't affect my life as much as it does now".    Assessment: Pt demonstrates behavior that inhibits/restricts participation in occupation and would benefit from skilled occupational therapy services to address current difficulties with symptom management, emotion regulation, socialization, stress management, time management, Jared Cannon readiness, financial wellness, health and nutrition, sleep hygiene, ADL/iADL performance and leisure participation, in preparation for reintegration and return to community at discharge.   Plan: Pt will participate in skilled occupational therapy sessions (group and/or individual) in order to promote daily structure, social engagement, and opportunities to develop and utilize adaptive strategies to maximize functional performance in preparation for safe transition and integration back into school, work, and/or the community at discharge. OT sessions will occur 4-5 x per week for 2-4 weeks.   Jared Cannon, MOT, OTR/L  Group Session:  S: "Someone taught me a long time ago about emotional intelligence, and  it's recognizing when my emotions are driving a decision or conversation and it helps me to hold back for the right now to discuss something without it being driven by anger or some other feeling."  O: Group began with a reflection from previous OT session on stress management. Today's group focused on topic of Communication Styles. Group members were educated on the different styles including passive, aggressive, and assertive communication. Members shared and reflected on which style they most often find themselves communicating in and how to transition to a more assertive approach.   A: Jared Cannon was active and independent in his participation of discussion and activity, sharing that he focuses his communication style around his emotional intelligence, which he described as recognizing when his behavior or words are fueled more by his emotions rather than a need being met. He was receptive and open to education provided to him, along with feedback and support from peers. He recognized that he has recently taken an aggressive approach to communication with his Mom, however recognized this is not effective and a place he wants to settle into. Pt expressed goal of practicing more assertiveness.   P: Continue to attend PHP OT group sessions 5x week for 3 weeks to promote daily structure, social engagement, and opportunities to develop and utilize adaptive strategies to maximize functional performance in preparation for safe transition and integration back into school, work, and the community. Plan to address topic of assertiveness in next OT group session.   Plan - 07/20/20 1452    Clinical Impression Statement Jared Cannon is a 50 y/o male with PMHx of severe OCD, anxiety, and depression who was referred to the [facility] program at the recommendation of his psychiatrist for reports of worsening OCD tendencies/routines leading to an increase in anxiety/depression and difficulty managing his daily routines. Pt reports several  increased psychosocial stressors including being on a leave of absence from work as a result of his mental health, moving back in with his elderly mother, and difficulty managing his ADL/iADLs. Pt reports a desire to engage in [facility] programming in order to manage identified stressors and to engage meaningfully in identified areas of occupation and ADL/iADLs.    OT Occupational Profile and History Problem Focused Assessment - Including review of records relating to presenting problem    Occupational performance deficits (Please refer to evaluation for details): ADL's;IADL's;Rest and Sleep;Education;Work;Social Participation;Leisure    Body Structure / Function / Physical Skills ADL  Cognitive Skills Attention;Consciousness;Emotional;Energy/Drive;Learn;Memory;Perception;Understand;Thought;Temperament/Personality;Safety Awareness;Problem Solve;Sequencing    Psychosocial Skills Coping Strategies;Environmental  Adaptations;Habits;Interpersonal Interaction;Routines and Behaviors    Rehab Potential Good    Clinical Decision Making Limited treatment options, no task modification necessary    Comorbidities Affecting Occupational Performance: May have comorbidities impacting occupational performance    Modification or Assistance to Complete Evaluation  No modification of tasks or assist necessary to complete eval    OT Frequency 5x / week    OT Duration 4 weeks    OT Treatment/Interventions Self-care/ADL training;Patient/family education;Coping strategies training;Psychosocial skills training    Consulted and Agree with Plan of Care Patient           Patient will benefit from skilled therapeutic intervention in order to improve the following deficits and impairments:   Body Structure / Function / Physical Skills: ADL Cognitive Skills: Attention,Consciousness,Emotional,Energy/Drive,Learn,Memory,Perception,Understand,Thought,Temperament/Personality,Safety Awareness,Problem Solve,Sequencing Psychosocial  Skills: Coping Strategies,Environmental  Adaptations,Habits,Interpersonal Interaction,Routines and Behaviors   Visit Diagnosis: Difficulty coping  Frontal lobe and executive function deficit  Obsessive-compulsive disorder, unspecified type    Problem List There are no problems to display for this patient.   07/20/2020  Jared Cannon, MOT, OTR/L  07/20/2020, 3:00 PM  [facility] HOSPITALIZATION PROGRAM [place], [place], [address] Phone: [phone]   Fax:  [phone]  Name: Omarian Jaquith MRN: [id number] Date of Birth: 1971/06/19

## 2020-07-21 ENCOUNTER — Encounter (HOSPITAL_COMMUNITY): Payer: Self-pay

## 2020-07-21 ENCOUNTER — Other Ambulatory Visit (HOSPITAL_COMMUNITY): Payer: 59 | Admitting: Occupational Therapy

## 2020-07-21 ENCOUNTER — Encounter (HOSPITAL_COMMUNITY): Payer: Self-pay | Admitting: Family

## 2020-07-21 ENCOUNTER — Other Ambulatory Visit: Payer: Self-pay

## 2020-07-21 ENCOUNTER — Other Ambulatory Visit (HOSPITAL_COMMUNITY): Payer: 59 | Admitting: Licensed Clinical Social Worker

## 2020-07-21 DIAGNOSIS — F331 Major depressive disorder, recurrent, moderate: Secondary | ICD-10-CM

## 2020-07-21 DIAGNOSIS — R4589 Other symptoms and signs involving emotional state: Secondary | ICD-10-CM

## 2020-07-21 DIAGNOSIS — F429 Obsessive-compulsive disorder, unspecified: Secondary | ICD-10-CM

## 2020-07-21 DIAGNOSIS — F428 Other obsessive-compulsive disorder: Secondary | ICD-10-CM | POA: Diagnosis not present

## 2020-07-21 DIAGNOSIS — R41844 Frontal lobe and executive function deficit: Secondary | ICD-10-CM

## 2020-07-21 NOTE — Progress Notes (Signed)
Virtual Visit via Video Note  I connected with Jared Cannon on 07/21/20 at  9:00 AM EST by a video enabled telemedicine application and verified that I am speaking with the correct person using two identifiers.  Location: Patient: home Provider: office   I discussed the limitations of evaluation and management by telemedicine and the availability of in person appointments. The patient expressed understanding and agreed to proceed.    I discussed the assessment and treatment plan with the patient. The patient was provided an opportunity to ask questions and all were answered. The patient agreed with the plan and demonstrated an understanding of the instructions.   The patient was advised to call back or seek an in-person evaluation if the symptoms worsen or if the condition fails to improve as anticipated.  I provided 15 minutes of non-face-to-face time during this encounter.   Oneta Rack, NP    Behavioral Health Partial Program Assessment Note  Date: 07/21/2020 Name: Jared Cannon MRN: [id number]   [id number] Bua  is a 50 y.o. Caucasian male presents with worsening anxiety related to his OCD symptoms.  Reports he is currently followed by psychiatry and therapy.  States he was taking Lexapro for a while however medication has been ineffective which she was recently started on Paxil.  Patient reports his depression and anxiety has become the dominant stressor and has interfered work life balance.  Does admit to passive suicidal ideations.  Denies plan or intent. "  I have obsessed over how I would do it, but currently do not have a plan."  Reported inpatient admission in early 20s.  States he has been diagnosed with obsessive-compulsive disorder since the age of 46. Reported rituals and obsessions are sometimes " paralyzing"  Jared Cannon reported " I get flustered, stressed and overwhelmed easily."  States" it is a vicious cycle," as patient reports ongoing ruminations and mild paranoia.  Patient  was enrolled in partial psychiatric program on 07/21/20.  Primary complaints include: anxiety, anxiety attacks, feeling depressed and feeling suicidal.  Onset of symptoms was gradual with gradually worsening course since that time. Psychosocial Stressors include the following: occupational.   I have reviewed the following documentation dated 07/20/2020: past psychiatric history, past medical history and past social and family history  Complaints of Pain: nonear Past Psychiatric History:  Past psychiatric hospitalizations  and Past medication trials  Currently in treatment with Paxil.  Substance Abuse History: none Use of Alcohol: denied Use of Caffeine: denies use Use of over the counter:   No past surgical history on file.  Past Medical History:  Diagnosis Date  . Heartburn   . OCD (obsessive compulsive disorder)    Outpatient Encounter Medications as of 07/20/2020  Medication Sig  . oxyCODONE-acetaminophen (PERCOCET/ROXICET) 5-325 MG per tablet Take 1-2 tablets by mouth every 6 (six) hours as needed for severe pain.   No facility-administered encounter medications on file as of 07/20/2020.   No Known Allergies  Social History   Tobacco Use  . Smoking status: Never Smoker  . Smokeless tobacco: Never Used  Substance Use Topics  . Alcohol use: Yes    Comment: occ   Functioning Relationships: good support system Education: Other Other Pertinent History: None No family history on file.   Review of Systems Constitutional: negative  Objective:  There were no vitals filed for this visit.  Physical Exam:   Mental Status Exam: Appearance:  Well groomed Psychomotor::  Within Normal Limits Attention span and concentration: Normal Behavior: calm, cooperative and  adequate rapport can be established Speech:  normal volume Mood:  depressed Affect:  normal Thought Process:  Coherent Thought Content:  Logical Orientation:  person, place and time/date Cognition:  grossly  intact Insight:  Intact Judgment:  Intact Estimate of Intelligence: Average Fund of knowledge: Aware of current events Memory: Recent and remote intact Abnormal movements: None Gait and station: Normal  Assessment:  Diagnosis: Severe episode of recurrent major depressive disorder, without psychotic features (HCC) [F33.2] 1. Severe episode of recurrent major depressive disorder, without psychotic features (HCC)   2. Other obsessive-compulsive disorders     Indications for admission: inpatient care required if not in partial hospital program  Plan: Orders placed for occupational therapy patient enrolled in Partial Hospitalization Program, patient's current medications are to be continued, a comprehensive treatment plan will be developed and side effects of medications have been reviewed with patient  Treatment options and alternatives reviewed with patient and patient understands the above plan.  Treatment plan was reviewed and agreed upon by NP T. Melvyn Neth and patient Jared Cannon need for group services    Oneta Rack, NP

## 2020-07-21 NOTE — Therapy (Signed)
[facility] PARTIAL HOSPITALIZATION PROGRAM [address] [place], [place], [address] Phone: [phone]   Fax:  [phone] Virtual Visit via Video Note  I connected with Jared Cannon on 07/21/20 at  11:00 AM EST by a video enabled telemedicine application and verified that I am speaking with the correct person using two identifiers.  Location: Patient: Patient Home  Provider: Clinic Office    I discussed the limitations of evaluation and management by telemedicine and the availability of in person appointments. The patient expressed understanding and agreed to proceed.   I discussed the assessment and treatment plan with the patient. The patient was provided an opportunity to ask questions and all were answered. The patient agreed with the plan and demonstrated an understanding of the instructions.   The patient was advised to call back or seek an in-person evaluation if the symptoms worsen or if the condition fails to improve as anticipated.  I provided 60 minutes of non-face-to-face time during this encounter.   Jared Cannon, OT   Occupational Therapy Treatment  Patient Details  Name: Jared Cannon MRN: [id number] Date of Birth: 1971/03/25 Referring Provider (OT): Hillery Jacks   Encounter Date: 07/21/2020   OT End of Session - 07/21/20 1258    Visit Number 2    Number of Visits 20    Date for OT Re-Evaluation 08/17/20    Authorization Type [employer]    OT Start Time 1115    OT Stop Time 1215    OT Time Calculation (min) 60 min    Activity Tolerance Patient tolerated treatment well    Behavior During Therapy [facility] for tasks assessed/performed           Past Medical History:  Diagnosis Date  . Heartburn   . OCD (obsessive compulsive disorder)     History reviewed. No pertinent surgical history.  There were no vitals filed for this visit.   Subjective Assessment - 07/21/20 1257    Currently in Pain? No/denies             OT Education - 07/21/20 1257    Education Details Educated on different communication styles with strategies to become more assertive with use of XYZ communication tool    Person(s) Educated Patient    Methods Explanation;Handout    Comprehension Verbalized understanding            OT Short Term Goals - 07/21/20 1258      OT SHORT TERM GOAL #1   Status On-going      OT SHORT TERM GOAL #2   Status On-going      OT SHORT TERM GOAL #3   Status On-going         Group Session:  S: "I think the biggest thing you have to remember is to have a tactful delivery in how you say what it is your are going to ask for or need."  O: Group began with a reflection from previous OT session focused on communication styles and group members re-iterated what was learned during previous session. Members shared and reflected on any opportunities they were presented with last evening to practice their assertiveness skills or recognize patterns of communication observed. Today's group focused on assertiveness skills training and use of the XYZ* assertive communication tool was introduced. The XYZ communication tool states: I feel X when you do Y in situation Z and I would like _________. X is the emotion, Y is the specific behavior, and  Z is the specific situation. Group members each formulated their own XYZ statement and shared with the group to discuss and offer feedback. Additional tips and strategies to practice being assertive were also introduced and discussed.   A: Jared Cannon was active in his participation of discussion and activity, offering relevant contributions to discussion and relating well with other group members. Pt actively engaged in activity of formulating an XYZ statement, however identified difficulty fitting his words into the formula while also sounding genuine in his delivery. Pt brainstormed additional suggestions out loud during session and offered appropriate feedback to peers.  P:  Continue to attend PHP OT group sessions 5x week for 4 weeks to promote daily structure, social engagement, and opportunities to develop and utilize adaptive strategies to maximize functional performance in preparation for safe transition and integration back into school, work, and the community.    Plan - 07/21/20 1258    Occupational performance deficits (Please refer to evaluation for details): ADL's;IADL's;Rest and Sleep;Education;Work;Social Participation;Leisure    Body Structure / Function / Physical Skills ADL    Cognitive Skills Attention;Consciousness;Emotional;Energy/Drive;Learn;Memory;Perception;Understand;Thought;Temperament/Personality;Safety Awareness;Problem Solve;Sequencing    Psychosocial Skills Coping Strategies;Environmental  Adaptations;Habits;Interpersonal Interaction;Routines and Behaviors           Patient will benefit from skilled therapeutic intervention in order to improve the following deficits and impairments:   Body Structure / Function / Physical Skills: ADL Cognitive Skills: Attention,Consciousness,Emotional,Energy/Drive,Learn,Memory,Perception,Understand,Thought,Temperament/Personality,Safety Awareness,Problem Solve,Sequencing Psychosocial Skills: Coping Strategies,Environmental  Adaptations,Habits,Interpersonal Interaction,Routines and Behaviors   Visit Diagnosis: Difficulty coping  Frontal lobe and executive function deficit  Obsessive-compulsive disorder, unspecified type    Problem List There are no problems to display for this patient.   07/21/2020  Jared Cannon, MOT, OTR/L  07/21/2020, 12:59 PM  [facility] HOSPITALIZATION PROGRAM [address] [place], [place], [address] Phone: [phone]   Fax:  [phone]  Name: Jared Cannon MRN: [id number] Date of Birth: 06-05-1971

## 2020-07-22 ENCOUNTER — Encounter (HOSPITAL_COMMUNITY): Payer: Self-pay

## 2020-07-22 ENCOUNTER — Other Ambulatory Visit (HOSPITAL_COMMUNITY): Payer: 59 | Admitting: Licensed Clinical Social Worker

## 2020-07-22 ENCOUNTER — Other Ambulatory Visit (HOSPITAL_COMMUNITY): Payer: 59 | Admitting: Occupational Therapy

## 2020-07-22 ENCOUNTER — Other Ambulatory Visit: Payer: Self-pay

## 2020-07-22 DIAGNOSIS — R41844 Frontal lobe and executive function deficit: Secondary | ICD-10-CM

## 2020-07-22 DIAGNOSIS — F331 Major depressive disorder, recurrent, moderate: Secondary | ICD-10-CM

## 2020-07-22 DIAGNOSIS — F429 Obsessive-compulsive disorder, unspecified: Secondary | ICD-10-CM

## 2020-07-22 DIAGNOSIS — R4589 Other symptoms and signs involving emotional state: Secondary | ICD-10-CM

## 2020-07-22 DIAGNOSIS — F428 Other obsessive-compulsive disorder: Secondary | ICD-10-CM | POA: Diagnosis not present

## 2020-07-22 NOTE — Progress Notes (Signed)
Spoke with patient via webex video call, used 2 identifiers to correctly identify patient. States this is his first time in [facility], was referred by Dr. Donell Beers for depression and suicide thoughts. Recently moved in with his mother and is finding the transition difficult with is OCD. He feels on edge and gets upset easily with his mother. He is currently on short term disability and not working. His OCD is getting worse and affects his concentration. On scale 1-10 as 10 being worst he rates depression at 4 and anxiety at 3. Denies SI/HI or AV hallucinations. PHQ9=10. No issues or complaints. Groups are going well. No side effects from medication.

## 2020-07-22 NOTE — Therapy (Signed)
[facility] PARTIAL HOSPITALIZATION PROGRAM [address] [place], [place], [address] Phone: [phone]   Fax:  [phone] Virtual Visit via Video Note  I connected with Jared Cannon on 07/22/20 at  11:00 AM EST by a video enabled telemedicine application and verified that I am speaking with the correct person using two identifiers.  Location: Patient: Patient Home Provider: Clinic Office    I discussed the limitations of evaluation and management by telemedicine and the availability of in person appointments. The patient expressed understanding and agreed to proceed.   I discussed the assessment and treatment plan with the patient. The patient was provided an opportunity to ask questions and all were answered. The patient agreed with the plan and demonstrated an understanding of the instructions.   The patient was advised to call back or seek an in-person evaluation if the symptoms worsen or if the condition fails to improve as anticipated.  I provided 65 minutes of non-face-to-face time during this encounter.   Jared Cannon, OT   Occupational Therapy Treatment  Patient Details  Name: Jared Cannon MRN: [id number] Date of Birth: [birth year] Referring Provider (OT): Hillery Jacks   Encounter Date: 07/22/2020   OT End of Session - 07/22/20 1445    Visit Number 3    Number of Visits 20    Date for OT Re-Evaluation 08/17/20    Authorization Type [employer]    OT Start Time 1110    OT Stop Time 1215    OT Time Calculation (min) 65 min    Activity Tolerance Patient tolerated treatment well    Behavior During Therapy [facility] for tasks assessed/performed           Past Medical History:  Diagnosis Date  . Heartburn   . OCD (obsessive compulsive disorder)     History reviewed. No pertinent surgical history.  There were no vitals filed for this visit.   Subjective Assessment - 07/22/20 1444    Currently in Pain? No/denies    Multiple  Pain Sites No           OT Education - 07/22/20 1445    Education Details Educated on self-care and provided tips/strategies to improving specific areas of self-care as it relates to one's mental health    Person(s) Educated Patient    Methods Explanation;Handout    Comprehension Verbalized understanding            OT Short Term Goals - 07/21/20 1258      OT SHORT TERM GOAL #1   Status On-going      OT SHORT TERM GOAL #2   Status On-going      OT SHORT TERM GOAL #3   Status On-going         Group Session:  S: "I like to watch Youtube videos as a way to take care of myself."  O: Today's group session focused on the topic of self-care and group member completed a self-care assessment that identified specific categories within self-care that needed improvement, including physical, emotional/psychological, social, spiritual, and professional. Discussion focused on identifying which areas need the most work/improvement and brainstormed strategies and tips to improve self-care. Group members were also encourgaed to identify one "big" and one "small" self-care activity they would like to engage in, in the near future.   A: Jared Cannon was active and independent in his participation of discussion and activity, sharing that one area of self-care that he struggles with his physical self-care. He  shared that he struggles to shower and wear clean clothes regularly, and this is typically out of character for him since he works for [employer]. Pt typically dresses business casual, however reports severe lack of hygiene since recent onset of depressive symptoms. Pt appeared receptive to strategies offered to him from peers, including making it more of an experience and less of a chore by playing music or buying fun soap.   P: Continue to attend PHP OT group sessions 5x week for 3 weeks to promote daily structure, social engagement, and opportunities to develop and utilize adaptive strategies to  maximize functional performance in preparation for safe transition and integration back into school, work, and the community.   Plan - 07/22/20 1445    Occupational performance deficits (Please refer to evaluation for details): ADL's;IADL's;Rest and Sleep;Education;Work;Social Participation;Leisure    Body Structure / Function / Physical Skills ADL    Cognitive Skills Attention;Consciousness;Emotional;Energy/Drive;Learn;Memory;Perception;Understand;Thought;Temperament/Personality;Safety Awareness;Problem Solve;Sequencing    Psychosocial Skills Coping Strategies;Environmental  Adaptations;Habits;Interpersonal Interaction;Routines and Behaviors           Patient will benefit from skilled therapeutic intervention in order to improve the following deficits and impairments:   Body Structure / Function / Physical Skills: ADL Cognitive Skills: Attention,Consciousness,Emotional,Energy/Drive,Learn,Memory,Perception,Understand,Thought,Temperament/Personality,Safety Awareness,Problem Solve,Sequencing Psychosocial Skills: Coping Strategies,Environmental  Adaptations,Habits,Interpersonal Interaction,Routines and Behaviors   Visit Diagnosis: Difficulty coping  Frontal lobe and executive function deficit  Obsessive-compulsive disorder, unspecified type    Problem List There are no problems to display for this patient.   07/22/2020  Jared Cannon, MOT, OTR/L  07/22/2020, 2:45 PM  [facility] HOSPITALIZATION PROGRAM [address] [place], [place], [address] Phone: [phone]   Fax:  [phone]  Name: Jared Cannon MRN: [id number] Date of Birth: [birth year]

## 2020-07-24 NOTE — Psych (Signed)
Virtual Visit via Video Note  I connected with Jared Cannon on 07/19/20 at  1:30 PM EST by a video enabled telemedicine application and verified that I am speaking with the correct person using two identifiers.  Location: Patient: patient home Provider: Clinical home office   I discussed the limitations of evaluation and management by telemedicine and the availability of in person appointments. The patient expressed understanding and agreed to proceed.  I discussed the assessment and treatment plan with the patient. The patient was provided an opportunity to ask questions and all were answered. The patient agreed with the plan and demonstrated an understanding of the instructions.   The patient was advised to call back or seek an in-person evaluation if the symptoms worsen or if the condition fails to improve as anticipated.  I provided 90 minutes of non-face-to-face time during this encounter.   [name], LCSW    Comprehensive Clinical Assessment (CCA) Note  07/24/2020 Jared Cannon [id number]  Chief Complaint: Anxiety, Depression  Visit Diagnosis: OCD, MDD  CCA Biopsychosocial Intake/Chief Complaint:  Pt presents as referral to PHP per his psychiatrist, Dr Donell Beers. Pt reports history of OCD and depression diagnoses. Pt states he has been diagnosed with OCD since age 26 and that it has been getting worse over the past year, and even more over the past 3 months. Pt states he has been unable to work for the last 3 months due to increase in his ritualistic behaviors and obsessions. Pt reports decline in ADL's and goal oriented tasks. Pt reports experiencing SI over the past few weeks and reports current intermittent passive SI and hopelessness. Pt denies AVH and substance use. Pt states he is exploring residential treatment at a facility that specializes in OCD however there is a 2 month waiting list.  Current Symptoms/Problems: Pt reports ritualistic behaviors, assurance seeking,  obsessive thoughts, depressed mood, hoplessness, and anxiety.   Patient Reported Schizophrenia/Schizoaffective Diagnosis in Past: No   Strengths: Pt has some insight, regular providers, is motivated for treatment  Preferences: intensive treatment  Abilities: Not assessed.   Type of Services Patient Feels are Needed: intensive   Initial Clinical Notes/Concerns: Pt may need specialized treatment rather than general.   Mental Health Symptoms Depression:  Fatigue; Difficulty Concentrating; Change in energy/activity; Hopelessness; Worthlessness   Duration of Depressive symptoms: Greater than two weeks   Mania:  None   Anxiety:   Worrying; Fatigue; Restlessness; Tension; Difficulty concentrating (Panic attacks, sweating.)   Psychosis:  None   Duration of Psychotic symptoms: No data recorded  Trauma:  None   Obsessions:  Recurrent & persistent thoughts/impulses/images; Cause anxiety; Disrupts routine/functioning; Good insight; Intrusive/time consuming; Attempts to suppress/neutralize   Compulsions:  Intended to reduce stress or prevent another outcome; Repeated behaviors/mental acts; "Driven" to perform behaviors/acts; Disrupts with routine/functioning; Good insight; Intrusive/time consuming   Inattention:  None   Hyperactivity/Impulsivity:  N/A   Oppositional/Defiant Behaviors:  None   Emotional Irregularity:  None   Other Mood/Personality Symptoms:  No data recorded   Mental Status Exam Appearance and self-care  Stature:  Average   Weight:  Average weight   Clothing:  Casual   Grooming:  Neglected   Cosmetic use:  None   Posture/gait:  Normal   Motor activity:  Repetitive   Sensorium  Attention:  Distractible; Persistent   Concentration:  Anxiety interferes   Orientation:  X5   Recall/memory:  Normal   Affect and Mood  Affect:  Anxious; Depressed   Mood:  Anxious; Depressed  Relating  Eye contact:  Normal   Facial expression:  Anxious    Attitude toward examiner:  Cooperative   Thought and Language  Speech flow: Normal   Thought content:  Appropriate to Mood and Circumstances   Preoccupation:  Obsessions (Anxiety, OCD.)   Hallucinations:  None   Organization:  No data recorded  [employer] of Knowledge:  Good; Average   Intelligence:  Average   Abstraction:  Functional   Judgement:  Fair   Reality Testing:  No data recorded  Insight:  Fair   Decision Making:  Normal   Social Functioning  Social Maturity:  No data recorded  Social Judgement:  No data recorded  Stress  Stressors:  Housing; Transitions; Work; Surveyor, quantity (OCD, anxiety, depression/sadness, living situation, on LOA at work.)   Coping Ability:  Human resources officer Deficits:  Activities of daily living; Scientist, physiological; Self-care   Supports:  Family     Religion: Religion/Spirituality Are You A Religious Person?: Yes What is Your Religious Affiliation?: Chiropodist: Leisure / Recreation Do You Have Hobbies?: No  Exercise/Diet: Exercise/Diet Do You Exercise?: No Do You Follow a Special Diet?: No Do You Have Any Trouble Sleeping?: No   CCA Employment/Education Employment/Work Situation: Employment / Work Psychologist, occupational Employment situation: Leave of absence (P has been on a LOA for 2-3 months from his job at [employer].) Patient's job has been impacted by current illness: Yes Describe how patient's job has been impacted: Pt states he has been on leave approximately 3 months due to [facility] sx Has patient ever been in the [employer]?: No  Education: Education Is Patient Currently Attending School?: No Did Garment/textile technologist From [employer]?: Yes Did Theme park manager?: Yes What Type of College Degree Do you Have?: 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 Did You Attend Graduate School?: No Did You Have An Individualized Education Program (IIEP): No Did You Have  Any Difficulty At School?: No Patient's Education Has Been Impacted by Current Illness: No   CCA Family/Childhood History Family and Relationship History: Family history Marital status: Single What is your sexual orientation?: heterosexual Has your sexual activity been affected by drugs, alcohol, medication, or emotional stress?: MH sx have left him isolated Does patient have children?: No  Childhood History:  Childhood History By whom was/is the patient raised?: Mother Additional childhood history information: UTA Description of patient's relationship with caregiver when they were a child: UTA Patient's description of current relationship with people who raised him/her: Pt currently lives with mom and states she is supportive, however they have their struggles How were you disciplined when you got in trouble as a child/adolescent?: UTA Does patient have siblings?: Yes Did patient suffer any verbal/emotional/physical/sexual abuse as a child?: No Did patient suffer from severe childhood neglect?: No Has patient ever been sexually abused/assaulted/raped as an adolescent or adult?: No Was the patient ever a victim of a crime or a disaster?: No Witnessed domestic violence?: No Has patient been affected by domestic violence as an adult?: No (NA)  Child/Adolescent Assessment:     CCA Substance Use Alcohol/Drug Use: Alcohol / Drug Use Pain Medications: See MAR Prescriptions: See MAR Over the Counter: See MAR History of alcohol / drug use?: No history of alcohol / drug abuse (Pt denies, use.)       ASAM's:  Six Dimensions of Multidimensional Assessment  Dimension 1:  Acute Intoxication and/or Withdrawal Potential:  Dimension 2:  Biomedical Conditions and Complications:      Dimension 3:  Emotional, Behavioral, or Cognitive Conditions and Complications:     Dimension 4:  Readiness to Change:     Dimension 5:  Relapse, Continued use, or Continued Problem Potential:      Dimension 6:  Recovery/Living Environment:     ASAM Severity Score:    ASAM Recommended Level of Treatment:     Substance use Disorder (SUD)    Recommendations for Services/Supports/Treatments: Recommendations for Services/Supports/Treatments Recommendations For Services/Supports/Treatments: Partial Hospitalization (Pt is recommended PHP due to increased disruption to functioning and depressive symptoms - need for stabilization)  DSM5 Diagnoses: There are no problems to display for this patient.   Patient Centered Plan: Patient is on the following Treatment Plan(s):  Anxiety   Referrals to Alternative Service(s): Referred to Alternative Service(s):   Place:   Date:   Time:    Referred to Alternative Service(s):   Place:   Date:   Time:    Referred to Alternative Service(s):   Place:   Date:   Time:    Referred to Alternative Service(s):   Place:   Date:   Time:     [name], LCSW

## 2020-07-25 ENCOUNTER — Other Ambulatory Visit: Payer: Self-pay

## 2020-07-25 ENCOUNTER — Other Ambulatory Visit (HOSPITAL_COMMUNITY): Payer: 59 | Admitting: Occupational Therapy

## 2020-07-25 ENCOUNTER — Other Ambulatory Visit (HOSPITAL_COMMUNITY): Payer: 59 | Admitting: Licensed Clinical Social Worker

## 2020-07-25 ENCOUNTER — Encounter (HOSPITAL_COMMUNITY): Payer: Self-pay | Admitting: Family

## 2020-07-25 ENCOUNTER — Encounter (HOSPITAL_COMMUNITY): Payer: Self-pay

## 2020-07-25 DIAGNOSIS — R41844 Frontal lobe and executive function deficit: Secondary | ICD-10-CM

## 2020-07-25 DIAGNOSIS — F428 Other obsessive-compulsive disorder: Secondary | ICD-10-CM

## 2020-07-25 DIAGNOSIS — R4589 Other symptoms and signs involving emotional state: Secondary | ICD-10-CM

## 2020-07-25 DIAGNOSIS — F429 Obsessive-compulsive disorder, unspecified: Secondary | ICD-10-CM

## 2020-07-25 DIAGNOSIS — F331 Major depressive disorder, recurrent, moderate: Secondary | ICD-10-CM

## 2020-07-25 MED ORDER — PAROXETINE HCL 20 MG PO TABS: 10.0000 mg | ORAL_TABLET | Freq: Every day | ORAL | 0 refills | Status: DC

## 2020-07-25 NOTE — Therapy (Signed)
[facility] PARTIAL HOSPITALIZATION PROGRAM [address] [place], [place], [address] Phone: [phone]   Fax:  [phone] Virtual Visit via Video Note  I connected with Jared Cannon on 07/25/20 at  11:00 AM EST by a video enabled telemedicine application and verified that I am speaking with the correct person using two identifiers.  Location: Patient: Patient Home Provider: Clinic Office   I discussed the limitations of evaluation and management by telemedicine and the availability of in person appointments. The patient expressed understanding and agreed to proceed.   I discussed the assessment and treatment plan with the patient. The patient was provided an opportunity to ask questions and all were answered. The patient agreed with the plan and demonstrated an understanding of the instructions.   The patient was advised to call back or seek an in-person evaluation if the symptoms worsen or if the condition fails to improve as anticipated.  I provided 60 minutes of non-face-to-face time during this encounter.   Donne Hazel, OT   Occupational Therapy Treatment  Patient Details  Name: Jared Cannon MRN: [id number] Date of Birth: 1970-11-25 Referring Provider (OT): Hillery Jacks   Encounter Date: 07/25/2020   OT End of Session - 07/25/20 1217    Visit Number 4    Number of Visits 20    Date for OT Re-Evaluation 08/17/20    Authorization Type [employer] 25.00 copay, 30 vist limit    Authorization - Number of Visits 30    OT Start Time 1105    OT Stop Time 1205    OT Time Calculation (min) 60 min    Activity Tolerance Patient tolerated treatment well    Behavior During Therapy WFL for tasks assessed/performed           Past Medical History:  Diagnosis Date  . Heartburn   . OCD (obsessive compulsive disorder)     History reviewed. No pertinent surgical history.  There were no vitals filed for this visit.   Subjective Assessment -  07/25/20 1217    Currently in Pain? No/denies           OT Education - 07/25/20 1217    Education Details Educated on concept of sensory modulation and self-soothing as coping strategies through use of the eight senses    Person(s) Educated Patient    Methods Explanation;Handout    Comprehension Verbalized understanding            OT Short Term Goals - 07/21/20 1258      OT SHORT TERM GOAL #1   Status On-going      OT SHORT TERM GOAL #2   Status On-going      OT SHORT TERM GOAL #3   Status On-going         Group Session:  S: "I could definitely see how many of these things are useful, but it's also helping me to point out things that make my anxiety worse, like when Mom has the TV on really loud."  O: Today's group session focused on topic of sensory modulation and self-soothing through use of the 8 senses. Discussion introduced the concept of sensory modulation and integration, focusing on how we can utilize our body and it's senses to self-soothe or cope, when we are experiencing an over or under-whelming sensation or feeling. Group members were introduced to a sensory diet checklist as a helpful tool/resource that can be utilized to identify what activities and strategies we prefer and do not prefer  based upon our response to different stimulus. The concept of alerting vs calming activities was also introduced to understand how to counteract how we are feeling (Example: when we are feeling overwhelmed/stressed, engage in something calming. When we are feeling depressed/low energy, engage in something alerting). Group members engaged actively in discussion sharing their own personal sensory likes/dislikes.    A: Jared Cannon was active and independent in his participation of discussion and activity, sharing several examples of sensory self-soothing preferences he had, in addition to offering feedback and suggestions to peers. Pt identified some alerting activities to him as shopping,  exercise, skateboarding, and watching surfing on TV. Pt identified more calming activities as petting a dog, listening to music, and watching the sunset. Pt identified washing his hands as something that would be triggering, in addition to the tv being loud when he has anxiety. Receptive to further education and support provided around self-soothing preferences.   P: Continue to attend PHP OT group sessions 5x week for 2 weeks to promote daily structure, social engagement, and opportunities to develop and utilize adaptive strategies to maximize functional performance in preparation for safe transition and integration back into school, work, and the community. Plan to address topic of sleep hygiene in next OT group session.   Plan - 07/25/20 1221    Occupational performance deficits (Please refer to evaluation for details): ADL's;IADL's;Rest and Sleep;Education;Work;Social Participation;Leisure    Body Structure / Function / Physical Skills ADL    Cognitive Skills Attention;Consciousness;Emotional;Energy/Drive;Learn;Memory;Perception;Understand;Thought;Temperament/Personality;Safety Awareness;Problem Solve;Sequencing    Psychosocial Skills Coping Strategies;Environmental  Adaptations;Habits;Interpersonal Interaction;Routines and Behaviors           Patient will benefit from skilled therapeutic intervention in order to improve the following deficits and impairments:   Body Structure / Function / Physical Skills: ADL Cognitive Skills: Attention,Consciousness,Emotional,Energy/Drive,Learn,Memory,Perception,Understand,Thought,Temperament/Personality,Safety Awareness,Problem Solve,Sequencing Psychosocial Skills: Coping Strategies,Environmental  Adaptations,Habits,Interpersonal Interaction,Routines and Behaviors   Visit Diagnosis: Difficulty coping  Frontal lobe and executive function deficit  Obsessive-compulsive disorder, unspecified type    Problem List There are no problems to display for  this patient.   07/25/2020  Donne Hazel, MOT, OTR/L  07/25/2020, 12:21 PM  [facility] HOSPITALIZATION PROGRAM [address] [place], [place], [address] Phone: [phone]   Fax:  [phone]  Name: Jared Cannon MRN: [id number] Date of Birth: 1971/06/10

## 2020-07-25 NOTE — Progress Notes (Signed)
Virtual Visit via Video Note  I connected with Jared Cannon on 07/25/20 at  9:00 AM EST by a video enabled telemedicine application and verified that I am speaking with the correct person using two identifiers.  Location: Patient: home/hotel Provider: Office   I discussed the limitations of evaluation and management by telemedicine and the availability of in person appointments. The patient expressed understanding and agreed to proceed.   I discussed the assessment and treatment plan with the patient. The patient was provided an opportunity to ask questions and all were answered. The patient agreed with the plan and demonstrated an understanding of the instructions.   The patient was advised to call back or seek an in-person evaluation if the symptoms worsen or if the condition fails to improve as anticipated.  I provided 15 minutes of non-face-to-face time during this encounter.   Oneta Rack, NP  [facility] MD/PA/NP OP Progress Note  07/25/2020 4:29 PM Jared Cannon  MRN:  [id number]  Evlaution: Jared Cannon was evaluated via WebEx.  He presents flat, guarded but pleasant.  Denying suicidal or homicidal ideations currently.  States " I had a rough weekend."  Patient reports a verbal disagreement between he and his mother.  Jared Cannon reported he went to stand a hotel after the disagreement, " I know I played a part, however I needed to get away to clear my head."  Patient reports calling the crisis line over the weekend.  He denied plan or intent.  Discussed titration to Paxil.  Patient to increase to 20 mg daily.  Continue to endorse ruminations and worsening OCD symptoms.  Patient reports concerns with finding a new therapist.  Reports due to insurance reasons he is unable to follow-up with current therapist.  NP to follow-up with treatment team on 07/26/2020.  Reports a good appetite.  States he is resting well throughout the night.  Support ,encouragement and  reassurance was provided.  Visit Diagnosis: No  diagnosis found.  Past Psychiatric History:   Past Medical History:  Past Medical History:  Diagnosis Date  . Heartburn   . OCD (obsessive compulsive disorder)    No past surgical history on file.  Family Psychiatric History:   Family History:  Family History  Problem Relation Age of Onset  . Alcohol abuse Paternal Grandfather     Social History:  Social History   Socioeconomic History  . Marital status: Single    Spouse name: Not on file  . Number of children: 0  . Years of education: Not on file  . Highest education level: Bachelor's degree (e.g., BA, AB, BS)  Occupational History  . Not on file  Tobacco Use  . Smoking status: Never Smoker  . Smokeless tobacco: Never Used  Vaping Use  . Vaping Use: Never used  Substance and Sexual Activity  . Alcohol use: Yes    Comment: occ  . Drug use: No  . Sexual activity: Not on file  Other Topics Concern  . Not on file  Social History Narrative  . Not on file   Social Determinants of Health   Financial Resource Strain: Not on file  Food Insecurity: Not on file  Transportation Needs: Not on file  Physical Activity: Not on file  Stress: Not on file  Social Connections: Not on file    Allergies: No Known Allergies  Metabolic Disorder Labs: No results found for: HGBA1C, MPG No results found for: PROLACTIN No results found for: CHOL, TRIG, HDL, CHOLHDL, VLDL, LDLCALC No results found  for: TSH  Therapeutic Level Labs: No results found for: LITHIUM No results found for: VALPROATE No components found for:  CBMZ  Current Medications: Current Outpatient Medications  Medication Sig Dispense Refill  . ALPRAZolam (XANAX) 1 MG tablet Take 1 mg by mouth 3 (three) times daily as needed.    . clomiPRAMINE (ANAFRANIL) 50 MG capsule Take 50 mg by mouth at bedtime. Take 4 capsules 200mg  qhs    . modafinil (PROVIGIL) 100 MG tablet Take 100 mg by mouth daily.    omeprazole (PRILOSEC) 10 MG capsule Take 10 mg by mouth as  needed.    Marland Kitchen oxyCODONE-acetaminophen (PERCOCET/ROXICET) 5-325 MG per tablet Take 1-2 tablets by mouth every 6 (six) hours as needed for severe pain. (Patient not taking: Reported on 07/22/2020) 20 tablet 0  . PARoxetine (PAXIL) 20 MG tablet Take 0.5 tablets (10 mg total) by mouth daily. 30 tablet 0  . risperiDONE (RISPERDAL) 0.5 MG tablet Take 0.5 mg by mouth at bedtime.     No current facility-administered medications for this visit.     Musculoskeletal: Strength & Muscle Tone: within normal limits Gait & Station: normal Patient leans: N/A  Psychiatric Specialty Exam: Review of Systems  There were no vitals taken for this visit.There is no height or weight on file to calculate BMI.  General Appearance: Casual  Eye Contact:  Good  Speech:  Clear and Coherent  Volume:  Normal  Mood:  Anxious and Depressed  Affect:  Congruent  Thought Process:  Coherent  Orientation:  Full (Time, Place, and Person)  Thought Content: Hallucinations: None   Suicidal Thoughts:  No  Homicidal Thoughts:  No  Memory:  Immediate;   Fair Recent;   Fair  Judgement:  Fair  Insight:  Fair  Psychomotor Activity:  Normal  Concentration:  Concentration: Fair  Recall:  07/24/2020 of Knowledge: Fair  Language: Good  Akathisia:  No  Handed:  Right  AIMS (if indicated):   Assets:  Communication Skills Desire for Improvement Resilience Social Support  ADL's:  Intact  Cognition: WNL  Sleep:  Good   Screenings: PHQ2-9   Flowsheet Row Counselor from 07/22/2020 [facility]PITALIZ[facility] PROGRAM  PHQ-2 Total Score 3  PHQ-9 Total Score 10       Assessment and Plan:  Continue partial hospitalization programming Increased Paxil 10 mg to 20 mg p.o. daily Additional resources for therapy services after partial hospitalization programming will be needed  Treatment plan was reviewed and agreed upon by NP T. 07/24/2020 and patient Jared Cannon  need for continued group services   Jared Sauger, NP 07/25/2020, 4:29 PM

## 2020-07-26 ENCOUNTER — Other Ambulatory Visit (HOSPITAL_COMMUNITY): Payer: 59

## 2020-07-26 ENCOUNTER — Telehealth (HOSPITAL_COMMUNITY): Payer: Self-pay | Admitting: Licensed Clinical Social Worker

## 2020-07-26 ENCOUNTER — Other Ambulatory Visit: Payer: Self-pay

## 2020-07-27 ENCOUNTER — Other Ambulatory Visit: Payer: Self-pay

## 2020-07-27 ENCOUNTER — Other Ambulatory Visit (HOSPITAL_COMMUNITY): Payer: 59

## 2020-07-28 ENCOUNTER — Other Ambulatory Visit: Payer: Self-pay

## 2020-07-28 ENCOUNTER — Encounter (HOSPITAL_COMMUNITY): Payer: Self-pay

## 2020-07-28 ENCOUNTER — Other Ambulatory Visit (HOSPITAL_COMMUNITY): Payer: 59 | Attending: Psychiatry | Admitting: Licensed Clinical Social Worker

## 2020-07-28 ENCOUNTER — Other Ambulatory Visit (HOSPITAL_COMMUNITY): Payer: 59 | Admitting: Occupational Therapy

## 2020-07-28 DIAGNOSIS — F32A Depression, unspecified: Secondary | ICD-10-CM | POA: Insufficient documentation

## 2020-07-28 DIAGNOSIS — Z79899 Other long term (current) drug therapy: Secondary | ICD-10-CM | POA: Diagnosis not present

## 2020-07-28 DIAGNOSIS — F419 Anxiety disorder, unspecified: Secondary | ICD-10-CM | POA: Diagnosis not present

## 2020-07-28 DIAGNOSIS — F429 Obsessive-compulsive disorder, unspecified: Secondary | ICD-10-CM

## 2020-07-28 DIAGNOSIS — R4589 Other symptoms and signs involving emotional state: Secondary | ICD-10-CM

## 2020-07-28 DIAGNOSIS — G4700 Insomnia, unspecified: Secondary | ICD-10-CM | POA: Insufficient documentation

## 2020-07-28 DIAGNOSIS — F331 Major depressive disorder, recurrent, moderate: Secondary | ICD-10-CM

## 2020-07-28 DIAGNOSIS — R454 Irritability and anger: Secondary | ICD-10-CM | POA: Diagnosis not present

## 2020-07-28 DIAGNOSIS — R41844 Frontal lobe and executive function deficit: Secondary | ICD-10-CM

## 2020-07-28 NOTE — Therapy (Signed)
[facility] PARTIAL HOSPITALIZATION PROGRAM [address] [place], [place], [address] Phone: [phone]   Fax:  [phone] Virtual Visit via Video Note  I connected with Jared Cannon on 07/28/20 at  11:00 AM EST by a video enabled telemedicine application and verified that I am speaking with the correct person using two identifiers.  Location: Patient: Patient Home Provider: Clinic Office   I discussed the limitations of evaluation and management by telemedicine and the availability of in person appointments. The patient expressed understanding and agreed to proceed.   I discussed the assessment and treatment plan with the patient. The patient was provided an opportunity to ask questions and all were answered. The patient agreed with the plan and demonstrated an understanding of the instructions.   The patient was advised to call back or seek an in-person evaluation if the symptoms worsen or if the condition fails to improve as anticipated.  I provided 60 minutes of non-face-to-face time during this encounter.   Donne Hazel, OT   Occupational Therapy Treatment  Patient Details  Name: Jared Cannon MRN: [id number] Date of Birth: 11/26/1970 Referring Provider (OT): Hillery Jacks   Encounter Date: 07/28/2020   OT End of Session - 07/28/20 1227    Visit Number 5    Number of Visits 20    Date for OT Re-Evaluation 08/17/20    Authorization Type [employer] 25.00 copay, 30 vist limit    Authorization - Number of Visits 30    OT Start Time 1110    OT Stop Time 1210    OT Time Calculation (min) 60 min    Activity Tolerance Patient tolerated treatment well    Behavior During Therapy [facility] for tasks assessed/performed;Anxious           Past Medical History:  Diagnosis Date  . Heartburn   . OCD (obsessive compulsive disorder)     History reviewed. No pertinent surgical history.  There were no vitals filed for this visit.   Subjective Assessment  - 07/28/20 1226    Currently in Pain? No/denies           OT Education - 07/28/20 1226    Education Details Educated on different factors that contribute to our ability to manage our time, along with specific time management tips/strategies, including procrastination    Person(s) Educated Patient    Methods Explanation;Handout    Comprehension Verbalized understanding            OT Short Term Goals - 07/21/20 1258      OT SHORT TERM GOAL #1   Status On-going      OT SHORT TERM GOAL #2   Status On-going      OT SHORT TERM GOAL #3   Status On-going         Group Session:  S: "I definitely do better with structure, I know that about myself. It has just been really difficult to get into any sort of routine since being out of work."  O: Group began with a warm-up ice breaker activity where patients were encouraged to reflect on the scenario "If you had 86,400 dollars dropped into your bank account at midnight and 24 hours to spend it, what you use the money for? The only rule was that any money not used disappeared after 24 hours." Warm-up activity was used as an Web designer and a way to connect that similar to the scenario of money, we do not get time back and there  are 86,400 seconds in a day. Warm-up transitioned into today's group focused on topic of Time Management. Group members identified ways in which they currently struggle with managing their time and discussion focused on alternative strategies to recognize how we can be more productive and intentional with our time and managing it appropriately. Group members also discussed specific strategies to overcome procrastination.   A: Clancey was active in his participation of discussion during today's session and shared that he does better with time management when he has a sense of structure or a routine that he follows. He shared that this has become increasingly more difficult since being out of work, as he no longer has a routine  or schedule that he follows. He appeared receptive to feedback and strategies provided during session, while also emphasizing that he utilizes the alarm and calendar apps on his phone as ways in which he can hold himself more accountable and productive.   P: Continue to attend PHP OT group sessions 5x week for 2 weeks to promote daily structure, social engagement, and opportunities to develop and utilize adaptive strategies to maximize functional performance in preparation for safe transition and integration back into school, work, and the community. Plan to address topic of procrastination in next OT group session.    Plan - 07/28/20 1227    Occupational performance deficits (Please refer to evaluation for details): ADL's;IADL's;Rest and Sleep;Education;Work;Social Participation;Leisure    Body Structure / Function / Physical Skills ADL    Cognitive Skills Attention;Consciousness;Emotional;Energy/Drive;Learn;Memory;Perception;Understand;Thought;Temperament/Personality;Safety Awareness;Problem Solve;Sequencing    Psychosocial Skills Coping Strategies;Environmental  Adaptations;Habits;Interpersonal Interaction;Routines and Behaviors           Patient will benefit from skilled therapeutic intervention in order to improve the following deficits and impairments:   Body Structure / Function / Physical Skills: ADL Cognitive Skills: Attention,Consciousness,Emotional,Energy/Drive,Learn,Memory,Perception,Understand,Thought,Temperament/Personality,Safety Awareness,Problem Solve,Sequencing Psychosocial Skills: Coping Strategies,Environmental  Adaptations,Habits,Interpersonal Interaction,Routines and Behaviors   Visit Diagnosis: Difficulty coping  Frontal lobe and executive function deficit  Obsessive-compulsive disorder, unspecified type    Problem List There are no problems to display for this patient.   07/28/2020  Donne Hazel, MOT, OTR/L  07/28/2020, 12:27 PM  [facility] HOSPITALIZATION PROGRAM [address] [place], [place], [address] Phone: [phone]   Fax:  [phone]  Name: Jared Cannon MRN: [id number] Date of Birth: May 27, 1971

## 2020-07-29 ENCOUNTER — Encounter (HOSPITAL_COMMUNITY): Payer: Self-pay

## 2020-07-29 ENCOUNTER — Other Ambulatory Visit (HOSPITAL_COMMUNITY): Payer: 59 | Admitting: Licensed Clinical Social Worker

## 2020-07-29 ENCOUNTER — Other Ambulatory Visit: Payer: Self-pay

## 2020-07-29 ENCOUNTER — Other Ambulatory Visit (HOSPITAL_COMMUNITY): Payer: 59 | Admitting: Occupational Therapy

## 2020-07-29 DIAGNOSIS — R41844 Frontal lobe and executive function deficit: Secondary | ICD-10-CM

## 2020-07-29 DIAGNOSIS — F429 Obsessive-compulsive disorder, unspecified: Secondary | ICD-10-CM

## 2020-07-29 DIAGNOSIS — F331 Major depressive disorder, recurrent, moderate: Secondary | ICD-10-CM

## 2020-07-29 DIAGNOSIS — R4589 Other symptoms and signs involving emotional state: Secondary | ICD-10-CM

## 2020-07-29 MED ORDER — PAROXETINE HCL 20 MG PO TABS: 20.0000 mg | ORAL_TABLET | Freq: Every day | ORAL | 0 refills | Status: DC

## 2020-07-29 NOTE — Therapy (Signed)
[facility] PARTIAL HOSPITALIZATION PROGRAM [address] [place], [place], [address] Phone: [phone]   Fax:  [phone] Virtual Visit via Video Note  I connected with Jared Cannon on 07/29/20 at  11:00 AM EST by a video enabled telemedicine application and verified that I am speaking with the correct person using two identifiers.  Location: Patient: Patient Home Provider: Clinic Office    I discussed the limitations of evaluation and management by telemedicine and the availability of in person appointments. The patient expressed understanding and agreed to proceed.   I discussed the assessment and treatment plan with the patient. The patient was provided an opportunity to ask questions and all were answered. The patient agreed with the plan and demonstrated an understanding of the instructions.   The patient was advised to call back or seek an in-person evaluation if the symptoms worsen or if the condition fails to improve as anticipated.  I provided 60 minutes of non-face-to-face time during this encounter.   Jared Cannon, OT   Occupational Therapy Treatment  Patient Details  Name: Jared Cannon MRN: [id number] Date of Birth: June 24, 1971 Referring Provider (OT): Hillery Jacks   Encounter Date: 07/29/2020   OT End of Session - 07/29/20 1217    Visit Number 6    Number of Visits 20    Date for OT Re-Evaluation 08/17/20    Authorization Type [employer] 25.00 copay, 30 vist limit    Authorization - Number of Visits 30    OT Start Time 1100    OT Stop Time 1200    OT Time Calculation (min) 60 min    Activity Tolerance Patient tolerated treatment well    Behavior During Therapy Anxious;Restless           Past Medical History:  Diagnosis Date  . Heartburn   . OCD (obsessive compulsive disorder)     History reviewed. No pertinent surgical history.  There were no vitals filed for this visit.   Subjective Assessment - 07/29/20 1216     Currently in Pain? No/denies           OT Education - 07/29/20 1216    Education Details Continued education on different factors that contribute to our ability to manage our time, along with specific time management tips/strategies, including procrastination    Person(s) Educated Patient    Methods Explanation;Handout    Comprehension Verbalized understanding            OT Short Term Goals - 07/21/20 1258      OT SHORT TERM GOAL #1   Status On-going      OT SHORT TERM GOAL #2   Status On-going      OT SHORT TERM GOAL #3   Status On-going         Group Session:  S: "I definitely think I could take on more responsibilities at work, especially not being in work right now."  O: Group began with a recap on strategies/tips learned from previous OT session focused on time management. Today's group continued to focus on topic of time management and reviewed additional strategies to manage and create schedules that are more efficient and effective in meeting their needs. Group members also discussed specific strategies to overcome procrastination.   A: Jared Cannon was active, although restless and anxious, during today's session focused on time management. Pt expressed difficulty taking on responsibilities at home, sharing that it is within his current schedule to do so, in order to help  his mom out, however does not always feel motivated or energized to get things done. Pt expressed difficulty with procrastination, however was receptive to strategies reviewed and expressed value of planning out rewards for himself after a big task is completed. Pt also expressed seeking out help from others including asking his sister to help out with his mom as well.   P: Continue to attend PHP OT group sessions 5x week for 2 weeks to promote daily structure, social engagement, and opportunities to develop and utilize adaptive strategies to maximize functional performance in preparation for safe transition  and integration back into school, work, and the community.    Plan - 07/29/20 1217    Occupational performance deficits (Please refer to evaluation for details): ADL's;IADL's;Rest and Sleep;Education;Work;Social Participation;Leisure    Body Structure / Function / Physical Skills ADL    Cognitive Skills Attention;Consciousness;Emotional;Energy/Drive;Learn;Memory;Perception;Understand;Thought;Temperament/Personality;Safety Awareness;Problem Solve;Sequencing    Psychosocial Skills Coping Strategies;Environmental  Adaptations;Habits;Interpersonal Interaction;Routines and Behaviors           Patient will benefit from skilled therapeutic intervention in order to improve the following deficits and impairments:   Body Structure / Function / Physical Skills: ADL Cognitive Skills: Attention,Consciousness,Emotional,Energy/Drive,Learn,Memory,Perception,Understand,Thought,Temperament/Personality,Safety Awareness,Problem Solve,Sequencing Psychosocial Skills: Coping Strategies,Environmental  Adaptations,Habits,Interpersonal Interaction,Routines and Behaviors   Visit Diagnosis: Difficulty coping  Frontal lobe and executive function deficit  Obsessive-compulsive disorder, unspecified type    Problem List There are no problems to display for this patient.   07/29/2020  Jared Cannon, MOT, OTR/L  07/29/2020, 12:17 PM  [facility] HOSPITALIZATION PROGRAM [address] [place], [place], [address] Phone: [phone]   Fax:  [phone]  Name: Jared Cannon MRN: [id number] Date of Birth: October 20, 1970

## 2020-08-01 ENCOUNTER — Other Ambulatory Visit (HOSPITAL_COMMUNITY): Payer: 59

## 2020-08-01 ENCOUNTER — Other Ambulatory Visit: Payer: Self-pay

## 2020-08-02 ENCOUNTER — Other Ambulatory Visit (HOSPITAL_COMMUNITY): Payer: 59 | Admitting: Occupational Therapy

## 2020-08-02 ENCOUNTER — Other Ambulatory Visit: Payer: Self-pay

## 2020-08-02 ENCOUNTER — Encounter (HOSPITAL_COMMUNITY): Payer: Self-pay

## 2020-08-02 ENCOUNTER — Other Ambulatory Visit (HOSPITAL_COMMUNITY): Payer: 59 | Admitting: Licensed Clinical Social Worker

## 2020-08-02 DIAGNOSIS — F429 Obsessive-compulsive disorder, unspecified: Secondary | ICD-10-CM | POA: Diagnosis not present

## 2020-08-02 DIAGNOSIS — F331 Major depressive disorder, recurrent, moderate: Secondary | ICD-10-CM

## 2020-08-02 DIAGNOSIS — R41844 Frontal lobe and executive function deficit: Secondary | ICD-10-CM

## 2020-08-02 DIAGNOSIS — R4589 Other symptoms and signs involving emotional state: Secondary | ICD-10-CM

## 2020-08-02 NOTE — Therapy (Signed)
[facility] PARTIAL HOSPITALIZATION PROGRAM [address] [place], [place], [address] Phone: [phone]   Fax:  [phone] Virtual Visit via Video Note  I connected with Jared Cannon on 08/02/20 at  12:00 PM EST by a video enabled telemedicine application and verified that I am speaking with the correct person using two identifiers.  Location: Patient: Patient Home Provider: Clinic Office   I discussed the limitations of evaluation and management by telemedicine and the availability of in person appointments. The patient expressed understanding and agreed to proceed.   I discussed the assessment and treatment plan with the patient. The patient was provided an opportunity to ask questions and all were answered. The patient agreed with the plan and demonstrated an understanding of the instructions.   The patient was advised to call back or seek an in-person evaluation if the symptoms worsen or if the condition fails to improve as anticipated.  I provided 50 minutes of non-face-to-face time during this encounter.   Donne Hazel, OT   Occupational Therapy Treatment  Patient Details  Name: Jared Cannon MRN: [id number] Date of Birth: [birth year] Referring Provider (OT): Hillery Jacks   Encounter Date: 08/02/2020   OT End of Session - 08/02/20 1429    Visit Number 7    Number of Visits 20    Date for OT Re-Evaluation 08/17/20    Authorization Type [employer] 25.00 copay, 30 vist limit    Authorization - Number of Visits 30    OT Start Time 1200    OT Stop Time 1250    OT Time Calculation (min) 50 min    Activity Tolerance Patient tolerated treatment well    Behavior During Therapy WFL for tasks assessed/performed           Past Medical History:  Diagnosis Date  . Heartburn   . OCD (obsessive compulsive disorder)     History reviewed. No pertinent surgical history.  There were no vitals filed for this visit.   Subjective Assessment -  08/02/20 1429    Currently in Pain? No/denies           OT Education - 08/02/20 1429    Education Details Educated on health and wellness and active movement practices through use of progressive muscle relaxation (PMR)    Person(s) Educated Patient    Methods Explanation;Handout    Comprehension Verbalized understanding            OT Short Term Goals - 07/21/20 1258      OT SHORT TERM GOAL #1   Status On-going      OT SHORT TERM GOAL #2   Status On-going      OT SHORT TERM GOAL #3   Status On-going         Group Session:  S: "That honestly made me feel so much better, I like learning stuff like this because I think it really helps."  O: Group encouraged increased participation and engagement through discussion focused on physical health and wellness.  Group discussion focused on our mind/body connection and identified ways in which our physical wellness affects our mental and emotional wellness. Following discussion, patients engaged in an active movement practice, where the OT guided patients through a variety of different stretches and progressive muscle relaxation (PMR) exercises. Patients were encouraged to engage in some form of active movement, exercise, or practice at least 1x day in order to improve overall health and wellness, along with the body/mind connection.  A: Jared Cannon was active in his participation of active movement and discussion, identifying benefit of exercises presented. Pt was observed to engage in 100% of presented PMR exercises and shared this is an active practice he would like to continue practicing moving forward in his routine and when experiencing intense emotions in which he needs to relax. Receptive to additional information and education received.   P: Continue to attend PHP OT group sessions 5x week for 2 weeks to promote daily structure, social engagement, and opportunities to develop and utilize adaptive strategies to maximize functional  performance in preparation for safe transition and integration back into school, work, and the community.    Plan - 08/02/20 1430    Occupational performance deficits (Please refer to evaluation for details): ADL's;IADL's;Rest and Sleep;Education;Work;Social Participation;Leisure    Body Structure / Function / Physical Skills ADL    Cognitive Skills Attention;Consciousness;Emotional;Energy/Drive;Learn;Memory;Perception;Understand;Thought;Temperament/Personality;Safety Awareness;Problem Solve;Sequencing    Psychosocial Skills Coping Strategies;Environmental  Adaptations;Habits;Interpersonal Interaction;Routines and Behaviors           Patient will benefit from skilled therapeutic intervention in order to improve the following deficits and impairments:   Body Structure / Function / Physical Skills: ADL Cognitive Skills: Attention,Consciousness,Emotional,Energy/Drive,Learn,Memory,Perception,Understand,Thought,Temperament/Personality,Safety Awareness,Problem Solve,Sequencing Psychosocial Skills: Coping Strategies,Environmental  Adaptations,Habits,Interpersonal Interaction,Routines and Behaviors   Visit Diagnosis: Difficulty coping  Frontal lobe and executive function deficit  Obsessive-compulsive disorder, unspecified type    Problem List There are no problems to display for this patient.   08/02/2020  Donne Hazel, MOT, OTR/L  08/02/2020, 2:31 PM  [facility] HOSPITALIZATION PROGRAM [address] [place], [place], [address] Phone: [phone]   Fax:  [phone]  Name: Jared Cannon MRN: [id number] Date of Birth: 1971-03-22

## 2020-08-02 NOTE — Progress Notes (Signed)
Spoke with patient via Webex video call, used 2 identifiers to correctly identify patient. States groups are going OK, he is learning good coping skills. He is looking for a new therapist that has more experience with OCD. Medications are working well. No side effects. On scale 1-10 as 10 being worst he rates depression at 3/4 and anxiety at 3. Denies SI/HI or AV hallucinations. No issues or complaints.

## 2020-08-03 ENCOUNTER — Other Ambulatory Visit: Payer: Self-pay

## 2020-08-03 ENCOUNTER — Encounter (HOSPITAL_COMMUNITY): Payer: Self-pay

## 2020-08-03 ENCOUNTER — Other Ambulatory Visit (HOSPITAL_COMMUNITY): Payer: 59 | Admitting: Licensed Clinical Social Worker

## 2020-08-03 ENCOUNTER — Other Ambulatory Visit (HOSPITAL_COMMUNITY): Payer: 59 | Admitting: Occupational Therapy

## 2020-08-03 DIAGNOSIS — F429 Obsessive-compulsive disorder, unspecified: Secondary | ICD-10-CM

## 2020-08-03 DIAGNOSIS — R41844 Frontal lobe and executive function deficit: Secondary | ICD-10-CM

## 2020-08-03 DIAGNOSIS — F331 Major depressive disorder, recurrent, moderate: Secondary | ICD-10-CM

## 2020-08-03 DIAGNOSIS — R4589 Other symptoms and signs involving emotional state: Secondary | ICD-10-CM

## 2020-08-03 NOTE — Psych (Signed)
Virtual Visit via Video Note  I connected with Jared Cannon on 07/20/20 at  9:00 AM EST by a video enabled telemedicine application and verified that I am speaking with the correct person using two identifiers.  Location: Patient: patient home Provider: clinical home office    I discussed the limitations of evaluation and management by telemedicine and the availability of in person appointments. The patient expressed understanding and agreed to proceed.  I discussed the assessment and treatment plan with the patient. The patient was provided an opportunity to ask questions and all were answered. The patient agreed with the plan and demonstrated an understanding of the instructions.   The patient was advised to call back or seek an in-person evaluation if the symptoms worsen or if the condition fails to improve as anticipated.  Pt was provided 240 minutes of non-face-to-face time during this encounter.   Jared Glass, LCSW    [facility] PHP THERAPIST PROGRESS NOTE  Jared Cannon [id number]  Session Time: 9:00 - 10:00  Participation Level: Active  Behavioral Response: CasualAlertAnxious and Depressed  Type of Therapy: Group Therapy  Treatment Goals addressed: Coping  Interventions: CBT, DBT, Supportive and Reframing  Summary: Clinician led check-in regarding current stressors and situation.Clinician utilized active listening and empathetic response and validated patient emotions. Clinician facilitated processing group on pertinent issues.   Therapist Response: Jared Cannon is a 50 y.o. male who presents with OCD and depression symptoms.  Patient arrived within time allowed and reports that he is feeling "okay." Patient rates hismood at [facility] a scale of 1-10 with 10 being great. Pt met with medical provider during this session.     Session Time: 10:00 - 11:00   Participation Level:Active  Behavioral Response:CasualAlertDepressed  Type of  Therapy:GroupTherapy  Treatment Goals addressed: Coping  Interventions:CBT, DBT, Supportive and Reframing  Summary:Cln led processing group for pt's current struggles. Group members shared stressors and provided support and feedback. Cln brought in topics of boundaries, healthy relationships, and unhealthy thought processes to inform discussion.  Therapist Response:Pt able to process and provide support to group. Pt met with RN during part of this group.      Session Time: 11:00- 12:00  Participation Level:Active  Behavioral Response:CasualAlertDepressed  Type of Therapy:Group therapy  Treatment Goals addressed: Coping  Interventions:Supportive, Reframing  Summary:Spiritual Care group  Therapist Response:Pt engaged in session. See chaplain note      Session Time: 12:00 -1:00  Participation Level:Active  Behavioral Response:CasualAlertDepressed  Type of Therapy:Group therapy  Treatment Goals addressed: Coping  Interventions:Psychosocial skills training, Supportive  Summary:12:00 - 12:50:Occupational Therapy group 12:50 -1:00 Clinician led check-out. Clinician assessed for immediate needs, medication compliance and efficacy, and safety concerns  Therapist Response:12:00 - 12:50:Patient engaged in group. See OT note. 12:50 - 1:00: At check-out, patientrates his mood at [facility] a scale of 1-10 with 10 being great.Pt reports afternoon plans ofdoing laundry and getting out of the house. Patient denies SI/HI at the end of group.     Suicidal/Homicidal: Nowithout intent/plan  Plan: Pt will continue in PHP while working to decrease depression symptoms; decrease negative impact of OCD symptoms, and increase ability to manage symptoms in a healthy manner.   Diagnosis: Obsessive-compulsive disorder, unspecified type [F42.9]    1. Obsessive-compulsive disorder, unspecified type   2. Other obsessive-compulsive disorders    3. Severe episode of recurrent major depressive disorder, without psychotic features ([place])       Jared Glass, LCSW 08/03/2020

## 2020-08-03 NOTE — Psych (Signed)
Virtual Visit via Video Note  I connected with Jared Cannon on 07/20/20 at  9:00 AM EST by a video enabled telemedicine application and verified that I am speaking with the correct person using two identifiers.  Location: Patient: patient home Provider: clinical home office   I discussed the limitations of evaluation and management by telemedicine and the availability of in person appointments. The patient expressed understanding and agreed to proceed.  I discussed the assessment and treatment plan with the patient. The patient was provided an opportunity to ask questions and all were answered. The patient agreed with the plan and demonstrated an understanding of the instructions.   The patient was advised to call back or seek an in-person evaluation if the symptoms worsen or if the condition fails to improve as anticipated.  Cln and pt completed treatment plan and pt states verbal alignment with plan. Pt reports verbal consent to treatment and group commitments.   I provided 10 minutes of non-face-to-face time during this encounter.   [name], LCSW

## 2020-08-03 NOTE — Therapy (Signed)
[facility] PARTIAL HOSPITALIZATION PROGRAM [address] [place], [place], [address] Phone: [phone]   Fax:  [phone] Virtual Visit via Video Note  I connected with [name] on 08/03/20 at  12:00 PM EST by a video enabled telemedicine application and verified that I am speaking with the correct person using two identifiers.  Location: Patient: Patient Home Provider: Clinic Office   I discussed the limitations of evaluation and management by telemedicine and the availability of in person appointments. The patient expressed understanding and agreed to proceed.   I discussed the assessment and treatment plan with the patient. The patient was provided an opportunity to ask questions and all were answered. The patient agreed with the plan and demonstrated an understanding of the instructions.   The patient was advised to call back or seek an in-person evaluation if the symptoms worsen or if the condition fails to improve as anticipated.  I provided 55 minutes of non-face-to-face time during this encounter.   Donne Hazel, OT   Occupational Therapy Treatment  Patient Details  Name: Jahlani Lorentz MRN: [id number] Date of Birth: 12-13-1970 Referring Provider (OT): Hillery Jacks   Encounter Date: 08/03/2020   OT End of Session - 08/03/20 1421    Visit Number 8    Number of Visits 20    Date for OT Re-Evaluation 08/17/20    Authorization Type [employer] 25.00 copay, 30 vist limit    Authorization - Number of Visits 30    OT Start Time 1200    OT Stop Time 1255    OT Time Calculation (min) 55 min    Activity Tolerance Patient tolerated treatment well    Behavior During Therapy WFL for tasks assessed/performed           Past Medical History:  Diagnosis Date  . Heartburn   . OCD (obsessive compulsive disorder)     History reviewed. No pertinent surgical history.  There were no vitals filed for this visit.   Subjective Assessment -  08/03/20 1421    Currently in Pain? No/denies           OT Education - 08/03/20 1421    Education Details Educated on identifying worry and utilized circle on control tool to categorize what we do and do not have control over    Person(s) Educated Patient    Methods Explanation;Handout    Comprehension Verbalized understanding            OT Short Term Goals - 07/21/20 1258      OT SHORT TERM GOAL #1   Status On-going      OT SHORT TERM GOAL #2   Status On-going      OT SHORT TERM GOAL #3   Status On-going         Group Session:  S: "I worry ALL the time, it's obsessive and I don't do anything about it."  O: Group session encouraged increased participation and engagement through discussion focused on worry and our circle of control. Group reviewed a powerpoint that discussed healthy vs unhealthy worry with specific examples provided. Discussion also focused on utilizing the circle of control outline to identify what is within our control, what we have influence on, and what is not in our control. Group members shared specific examples and worries and identified what categories they fell in within the circle of control.   A: Samarth was active in his participation of discussion and shared that he worries "all the  time" and it's obsessive in nature. He was active in working through the circle of control tool and identified his small worry as "cutting his nails." Pt worked through his scenario in what he can and cannot control about cutting his nails.   P: Continue to attend PHP OT group sessions 5x week for 2 weeks to promote daily structure, social engagement, and opportunities to develop and utilize adaptive strategies to maximize functional performance in preparation for safe transition and integration back into school, work, and the community. Plan to address topic of stress management in next OT group session.   Plan - 08/03/20 1421    Occupational performance deficits (Please  refer to evaluation for details): ADL's;IADL's;Rest and Sleep;Education;Work;Social Participation;Leisure    Body Structure / Function / Physical Skills ADL    Cognitive Skills Attention;Consciousness;Emotional;Energy/Drive;Learn;Memory;Perception;Understand;Thought;Temperament/Personality;Safety Awareness;Problem Solve;Sequencing    Psychosocial Skills Coping Strategies;Environmental  Adaptations;Habits;Interpersonal Interaction;Routines and Behaviors           Patient will benefit from skilled therapeutic intervention in order to improve the following deficits and impairments:   Body Structure / Function / Physical Skills: ADL Cognitive Skills: Attention,Consciousness,Emotional,Energy/Drive,Learn,Memory,Perception,Understand,Thought,Temperament/Personality,Safety Awareness,Problem Solve,Sequencing Psychosocial Skills: Coping Strategies,Environmental  Adaptations,Habits,Interpersonal Interaction,Routines and Behaviors   Visit Diagnosis: Difficulty coping  Frontal lobe and executive function deficit  Obsessive-compulsive disorder, unspecified type    Problem List There are no problems to display for this patient.   08/03/2020  Donne Hazel, MOT, OTR/L  08/03/2020, 2:22 PM  [facility] HOSPITALIZATION PROGRAM [address] [place], [place], [address] Phone: [phone]   Fax:  [phone]  Name: Arush Gatliff MRN: [id number] Date of Birth: 07/28/1970

## 2020-08-03 NOTE — Psych (Signed)
Virtual Visit via Video Note  I connected with Jared Cannon on 07/21/20 at  9:00 AM EST by a video enabled telemedicine application and verified that I am speaking with the correct person using two identifiers.  Location: Patient: patient home Provider: clinical home office    I discussed the limitations of evaluation and management by telemedicine and the availability of in person appointments. The patient expressed understanding and agreed to proceed.  I discussed the assessment and treatment plan with the patient. The patient was provided an opportunity to ask questions and all were answered. The patient agreed with the plan and demonstrated an understanding of the instructions.   The patient was advised to call back or seek an in-person evaluation if the symptoms worsen or if the condition fails to improve as anticipated.  Pt was provided 240 minutes of non-face-to-face time during this encounter.   [name], LCSW    [facility] PHP THERAPIST PROGRESS NOTE  Jared Cannon [id number]  Session Time: 9:00 - 10:00  Participation Level: Active  Behavioral Response: CasualAlertAnxious and Depressed  Type of Therapy: Group Therapy  Treatment Goals addressed: Coping  Interventions: CBT, DBT, Supportive and Reframing  Summary: Clinician led check-in regarding current stressors and situation.Clinician utilized active listening and empathetic response and validated patient emotions. Clinician facilitated processing group on pertinent issues.   Therapist Response: Jared Cannon is a 50 y.o. male who presents with OCD and depression symptoms.  Patient arrived within time allowed and reports that he is feeling "pretty good." Patient rates hismood at [facility] a scale of 1-10 with 10 being great. Pt reports he had a dip in mood after group and was depressed. Pt states he got out of the house and that helped some. Pt reports increase in ritual behaviors when in public. Pt struggles with tangential  speech. Pt able to process. Pt engaged in discussion.     Session Time: 10:00 - 11:00   Participation Level:Active  Behavioral Response:CasualAlertDepressed  Type of Therapy:GroupTherapy  Treatment Goals addressed: Coping  Interventions:CBT, DBT, Supportive and Reframing  Summary:Cln led discussion on rumination: and how it can affect [place] negatively. Group members share topics, situations, and times in which rumination is most problem problematic for them. Cln encouraged pt's to consider DBT distraction and STOP skills or CBT thought challenging to manage rumination.   Therapist Response: Pt engaged in discussion and identifies rumination is most problematic for him when thinking of past triggers.      Session Time: 11:00- 12:00  Participation Level:Active  Behavioral Response:CasualAlertDepressed  Type of Therapy: Group Therapy, OT  Treatment Goals addressed: Coping  Interventions:Psychosocial skills training, Supportive  Summary:Occupational Therapy group  Therapist Response:Patient engaged in group. See OT note.      Session Time: 12:00 -1:00  Participation Level:Active  Behavioral Response:CasualAlertDepressed  Type of Therapy:Group therapy  Treatment Goals addressed: Coping  Interventions:CBT; Solution focused; Supportive; Reframing  Summary:12:00 - 12:50: Cln continued topic of CBT cognitive distortions. Cln utilized handout "cognitive distortions" to discuss common examples of distorted thoughts and group members worked to identify examples in their own life.  12:50 -1:00 Clinician led check-out. Clinician assessed for immediate needs, medication compliance and efficacy, and safety concerns  Therapist Response:12:00 - 12:50: Pt enaged in discussion and is able to determine examples of distorted thinking in their own life.  12:50 - 1:00: At check-out, patientrates his mood at [facility] a scale of 1-10 with 10  being great.Pt reports afternoon plans ofshopping. Patient denies SI/HI at the end  of group.     Suicidal/Homicidal: Nowithout intent/plan  Plan: Pt will continue in PHP while working to decrease depression symptoms; decrease negative impact of OCD symptoms, and increase ability to manage symptoms in a healthy manner.   Diagnosis: MDD (major depressive disorder), recurrent episode, moderate (HCC) [F33.1]    1. MDD (major depressive disorder), recurrent episode, moderate (HCC)   2. Obsessive-compulsive disorder, unspecified type       [name], LCSW 08/03/2020

## 2020-08-04 ENCOUNTER — Other Ambulatory Visit: Payer: Self-pay

## 2020-08-04 ENCOUNTER — Other Ambulatory Visit (HOSPITAL_COMMUNITY): Payer: 59 | Admitting: Licensed Clinical Social Worker

## 2020-08-04 ENCOUNTER — Encounter (HOSPITAL_COMMUNITY): Payer: Self-pay

## 2020-08-04 ENCOUNTER — Other Ambulatory Visit (HOSPITAL_COMMUNITY): Payer: 59 | Admitting: Occupational Therapy

## 2020-08-04 DIAGNOSIS — R41844 Frontal lobe and executive function deficit: Secondary | ICD-10-CM

## 2020-08-04 DIAGNOSIS — F429 Obsessive-compulsive disorder, unspecified: Secondary | ICD-10-CM

## 2020-08-04 DIAGNOSIS — F331 Major depressive disorder, recurrent, moderate: Secondary | ICD-10-CM

## 2020-08-04 DIAGNOSIS — R4589 Other symptoms and signs involving emotional state: Secondary | ICD-10-CM

## 2020-08-04 NOTE — Psych (Signed)
Virtual Visit via Video Note  I connected with Jared Cannon on 07/22/20 at  9:00 AM EST by a video enabled telemedicine application and verified that I am speaking with the correct person using two identifiers.  Location: Jared Cannon: Jared Cannon home Provider: clinical home office    I discussed the limitations of evaluation and management by telemedicine and the availability of in person appointments. The Jared Cannon expressed understanding and agreed to proceed.  I discussed the assessment and treatment plan with the Jared Cannon. The Jared Cannon was provided an opportunity to ask questions and all were answered. The Jared Cannon agreed with the plan and demonstrated an understanding of the instructions.   The Jared Cannon was advised to call back or seek an in-person evaluation if the symptoms worsen or if the condition fails to improve as anticipated.  Pt was provided 240 minutes of non-face-to-face time during this encounter.   [name], LCSW    [facility] PHP THERAPIST PROGRESS NOTE  Jared Cannon [id number]  Session Time: 9:00 - 10:00  Participation Level: Active  Behavioral Response: CasualAlertAnxious and Depressed  Type of Therapy: Group Therapy  Treatment Goals addressed: Coping  Interventions: CBT, DBT, Supportive and Reframing  Summary: Clinician led check-in regarding current stressors and situation.Clinician utilized active listening and empathetic response and validated Jared Cannon emotions. Clinician facilitated processing group on pertinent issues.   Therapist Response: Jared Cannon is a 50 y.o. male who presents with OCD and depression symptoms.  Jared Cannon arrived within time allowed and reports that he is feeling "okay." Jared Cannon rates hismood at [facility] a scale of 1-10 with 10 being great. Pt reports he took a 4.5 hour nap after group yesterday. Pt states he started doing laundry but was unable to complete the task. Pt states struggling with procrastination. Pt able to process. Pt engaged in  discussion.     Session Time: 10:00 - 11:00   Participation Level:Active  Behavioral Response:CasualAlertDepressed  Type of Therapy:GroupTherapy  Treatment Goals addressed: Coping  Interventions:CBT, DBT, Supportive and Reframing  Summary:Cln led discussion on feelings and the role they play for our lives. Cln contextualized feelings as a warning system that brings attention to areas of our lives that need focus. Group members discussed how to pay attention to what the feeling is telling us versus reacting to unpleasant aspects of a feeling.   Therapist Response: Pt engaged in discussion and reports increased insight.      Session Time: 11:00- 12:00  Participation Level:Active  Behavioral Response:CasualAlertDepressed  Type of Therapy: Group Therapy, OT  Treatment Goals addressed: Coping  Interventions:Psychosocial skills training, Supportive  Summary:Occupational Therapy group  Therapist Response:Jared Cannon engaged in group. See OT note.      Session Time: 12:00 -1:00  Participation Level:Active  Behavioral Response:CasualAlertDepressed  Type of Therapy:Group therapy  Treatment Goals addressed: Coping  Interventions:CBT; Solution focused; Supportive; Reframing  Summary:12:00 - 12:50: Cln continued topic of CBT cognitive distortions and utilized handout "Unhealthy Thought Patterns"to review common examples of distorted thought to increase awareness of the distorted thoughts. 12:50 -1:00 Clinician led check-out. Clinician assessed for immediate needs, medication compliance and efficacy, and safety concerns  Therapist Response:12:00 - 12:50: Pt engaged in discussion and is able to make connections from his life to the distorted thoughts.   12:50 - 1:00: At check-out, patientrates his mood at [facility] a scale of 1-10 with 10 being great.Pt reports afternoon plans ofgetting out of the house. Jared Cannon denies SI/HI at the  end of group.     Suicidal/Homicidal: Nowithout intent/plan  Plan: Pt  will continue in PHP while working to decrease depression symptoms; decrease negative impact of OCD symptoms, and increase ability to manage symptoms in a healthy manner.   Diagnosis: Obsessive-compulsive disorder, unspecified type [F42.9]    1. Obsessive-compulsive disorder, unspecified type   2. MDD (major depressive disorder), recurrent episode, moderate (HCC)       [name], LCSW 08/04/2020

## 2020-08-04 NOTE — Therapy (Signed)
[facility] PARTIAL HOSPITALIZATION PROGRAM [address] [place], [place], [address] Phone: [phone]   Fax:  [phone] Virtual Visit via Video Note  I connected with Jared Cannon on 08/04/20 at  11:00 AM EST by a video enabled telemedicine application and verified that I am speaking with the correct person using two identifiers.  Location: Patient: Patient Home Provider: Clinic Office   I discussed the limitations of evaluation and management by telemedicine and the availability of in person appointments. The patient expressed understanding and agreed to proceed.   I discussed the assessment and treatment plan with the patient. The patient was provided an opportunity to ask questions and all were answered. The patient agreed with the plan and demonstrated an understanding of the instructions.   The patient was advised to call back or seek an in-person evaluation if the symptoms worsen or if the condition fails to improve as anticipated.  I provided 65 minutes of non-face-to-face time during this encounter.   Donne Hazel, OT   Occupational Therapy Treatment  Patient Details  Name: Jared Cannon MRN: [id number] Date of Birth: 1971/06/07 Referring Provider (OT): Hillery Jacks   Encounter Date: 08/04/2020   OT End of Session - 08/04/20 1312    Visit Number 9    Number of Visits 20    Date for OT Re-Evaluation 08/17/20    Authorization Type [employer] 25.00 copay, 30 vist limit    OT Start Time 1100    OT Stop Time 1205    OT Time Calculation (min) 65 min    Activity Tolerance Patient tolerated treatment well    Behavior During Therapy WFL for tasks assessed/performed           Past Medical History:  Diagnosis Date  . Heartburn   . OCD (obsessive compulsive disorder)     History reviewed. No pertinent surgical history.  There were no vitals filed for this visit.   Subjective Assessment - 08/04/20 1311    Currently in Pain?  No/denies           OT Education - 08/04/20 1311    Education Details Educated on goal-setting using the SMART goal framework    Person(s) Educated Patient    Methods Explanation;Handout    Comprehension Verbalized understanding            OT Short Term Goals - 07/21/20 1258      OT SHORT TERM GOAL #1   Status On-going      OT SHORT TERM GOAL #2   Status On-going      OT SHORT TERM GOAL #3   Status On-going         Group Session:  S: "I never set goals, I probably should though and I can think of one right now that I need to set."  O: Today's group session encouraged engagement and participation through discussion focused on procrastination and Goal setting. Group members were introduced to goal-setting using the SMART goal framework, identifying goals as Specific, Measurable, Achievable, Relevant, and Time-Bound. Group members took time from group to create their own personal goal reflecting the SMART goal template and shared for review by peers and OT.     A: Daeshaun was late to group, however appeared attentive and engaged once present. Pt shared that he never really goals in the past, however recognized the importance of goal-setting and why it may be something he would like to do in the future. Pt identified a long-term goal  to fit the SMART framework, identifying objective of "I will buy a townhouse or condo in five years." Pt was able to explain each of the SMART elements of his goal and even identified shorter and medium term to goals he would need to achieve in order to get there including creating a savings account and setting aside a certain dollar amount each month. Pt was mostly receptive to support and further education received during session.   P: Continue to attend PHP OT group sessions 5x week for 2 weeks to promote daily structure, social engagement, and opportunities to develop and utilize adaptive strategies to maximize functional performance in preparation for  safe transition and integration back into school, work, and the community.     Plan - 08/04/20 1312    Occupational performance deficits (Please refer to evaluation for details): ADL's;IADL's;Rest and Sleep;Education;Work;Social Participation;Leisure    Body Structure / Function / Physical Skills ADL    Cognitive Skills Attention;Consciousness;Emotional;Energy/Drive;Learn;Memory;Perception;Understand;Thought;Temperament/Personality;Safety Awareness;Problem Solve;Sequencing    Psychosocial Skills Coping Strategies;Environmental  Adaptations;Habits;Interpersonal Interaction;Routines and Behaviors           Patient will benefit from skilled therapeutic intervention in order to improve the following deficits and impairments:   Body Structure / Function / Physical Skills: ADL Cognitive Skills: Attention,Consciousness,Emotional,Energy/Drive,Learn,Memory,Perception,Understand,Thought,Temperament/Personality,Safety Awareness,Problem Solve,Sequencing Psychosocial Skills: Coping Strategies,Environmental  Adaptations,Habits,Interpersonal Interaction,Routines and Behaviors   Visit Diagnosis: Difficulty coping  Frontal lobe and executive function deficit  Obsessive-compulsive disorder, unspecified type    Problem List There are no problems to display for this patient.   08/04/2020  Donne Hazel, MOT, OTR/L  08/04/2020, 1:12 PM  [facility] HOSPITALIZATION PROGRAM [address] [place], [place], [address] Phone: [phone]   Fax:  [phone]  Name: Jared Cannon MRN: [id number] Date of Birth: [birth year]

## 2020-08-05 ENCOUNTER — Encounter (HOSPITAL_COMMUNITY): Payer: Self-pay

## 2020-08-05 ENCOUNTER — Other Ambulatory Visit (HOSPITAL_COMMUNITY): Payer: 59 | Admitting: Licensed Clinical Social Worker

## 2020-08-05 ENCOUNTER — Other Ambulatory Visit: Payer: Self-pay

## 2020-08-05 ENCOUNTER — Other Ambulatory Visit (HOSPITAL_COMMUNITY): Payer: 59 | Admitting: Occupational Therapy

## 2020-08-05 DIAGNOSIS — F331 Major depressive disorder, recurrent, moderate: Secondary | ICD-10-CM

## 2020-08-05 DIAGNOSIS — F429 Obsessive-compulsive disorder, unspecified: Secondary | ICD-10-CM

## 2020-08-05 DIAGNOSIS — R4589 Other symptoms and signs involving emotional state: Secondary | ICD-10-CM

## 2020-08-05 DIAGNOSIS — R41844 Frontal lobe and executive function deficit: Secondary | ICD-10-CM
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
## 2020-08-05 NOTE — Progress Notes (Signed)
Virtual Visit via Video Note  I connected with Jared Cannon on 08/05/20 at  9:00 AM EST by a video enabled telemedicine application and verified that I am speaking with the correct person using two identifiers.  Location: Patient: home Provider: home   I discussed the limitations of evaluation and management by telemedicine and the availability of in person appointments. The patient expressed understanding and agreed to proceed.  I discussed the assessment and treatment plan with the patient. The patient was provided an opportunity to ask questions and all were answered. The patient agreed with the plan and demonstrated an understanding of the instructions.   The patient was advised to call back or seek an in-person evaluation if the symptoms worsen or if the condition fails to improve as anticipated.  I provided 15 minutes of non-face-to-face time during this encounter.   Jared Rack, NP   BH MD/PA/NP OP Progress Note  08/05/2020 11:14 AM Jared Cannon  MRN:  [id number]   Evaluation: Jared Cannon  was seen and evaluated via WebEx.  He presents with a bright and pleasant affect.  Denying suicidal or homicidal ideations.  Denies auditory or visual hallucinations.  Reports he is hopeful to attend a residential treatment facility program [facility]) which specializes in obsessive-compulsive disorders.  States he is adjusted his insurance to ensure that he is able to attend this facility for OCD.  Continues to endorse retreat ritualistic behavior however has been able to utilize distraction techniques.  NP titrated Paxil from 10 mg to 20 mg daily.  He reports a follow-up with his attending psychiatrist without any medication adjustments.  Patient to continue partial hospitalization programming.  Support, encouragement reassurance was provided.  Visit Diagnosis: No diagnosis found.  Past Psychiatric History:  Past Medical History:  Past Medical History:  Diagnosis Date   Heartburn    OCD  (obsessive compulsive disorder)    No past surgical history on file.  Family Psychiatric History:   Family History:  Family History  Problem Relation Age of Onset   Alcohol abuse Paternal Grandfather     Social History:  Social History   Socioeconomic History   Marital status: Single    Spouse name: Not on file   Number of children: 0   Years of education: Not on file   Highest education level: Bachelor's degree (e.g., BA, AB, BS)  Occupational History   Not on file  Tobacco Use   Smoking status: Never Smoker   Smokeless tobacco: Never Used  Vaping Use   Vaping Use: Never used  Substance and Sexual Activity   Alcohol use: Yes    Comment: occ   Drug use: No   Sexual activity: Not on file  Other Topics Concern   Not on file  Social History Narrative   Not on file   Social Determinants of Health   Financial Resource Strain: Not on file  Food Insecurity: Not on file  Transportation Needs: Not on file  Physical Activity: Not on file  Stress: Not on file  Social Connections: Not on file    Allergies: No Known Allergies  Metabolic Disorder Labs: No results found for: HGBA1C, MPG No results found for: PROLACTIN No results found for: CHOL, TRIG, HDL, CHOLHDL, VLDL, LDLCALC No results found for: TSH  Therapeutic Level Labs: No results found for: LITHIUM No results found for: VALPROATE No components found for:  CBMZ  Current Medications: Current Outpatient Medications  Medication Sig Dispense Refill   ALPRAZolam (XANAX) 1 MG  tablet Take 1 mg by mouth 3 (three) times daily as needed.     clomiPRAMINE (ANAFRANIL) 50 MG capsule Take 50 mg by mouth at bedtime. Take 4 capsules 200mg  qhs     modafinil (PROVIGIL) 100 MG tablet Take 100 mg by mouth daily.     omeprazole (PRILOSEC) 10 MG capsule Take 10 mg by mouth as needed.     oxyCODONE-acetaminophen (PERCOCET/ROXICET) 5-325 MG per tablet Take 1-2 tablets by mouth every 6 (six) hours as needed for  severe pain. (Patient not taking: No sig reported) 20 tablet 0   PARoxetine (PAXIL) 20 MG tablet Take 1 tablet (20 mg total) by mouth daily. 30 tablet 0   risperiDONE (RISPERDAL) 0.5 MG tablet Take 0.5 mg by mouth at bedtime.     No current facility-administered medications for this visit.     Musculoskeletal: Strength & Muscle Tone: within normal limits Gait & Station: normal Patient leans: N/A  Psychiatric Specialty Exam: Review of Systems  There were no vitals taken for this visit.There is no height or weight on file to calculate BMI.  General Appearance: Casual  Eye Contact:  Good  Speech:  Clear and Coherent  Volume:  Normal  Mood:  Anxious and Depressed  Affect:  Congruent  Thought Process:  Coherent  Orientation:  Full (Time, Place, and Person)  Thought Content: Hallucinations: None   Suicidal Thoughts:  No  Homicidal Thoughts:  No  Memory:  Immediate;   Fair Recent;   Fair  Judgement:  Fair  Insight:  Good  Psychomotor Activity:  Normal  Concentration:  Concentration: Fair  Recall:  Good  Fund of Knowledge: Fair  Language: Good  Akathisia:  No  Handed:  Right  AIMS (if indicated):  Assets:  Communication Skills Desire for Improvement Resilience Social Support  ADL's:  Intact  Cognition: WNL  Sleep:  Fair   Screenings: from 07/22/2020 in [facility] PARTIAL HOSPITALIZATION PROGRAM  PHQ-2 Total Score 3  PHQ-9 Total Score 10       Assessment and Plan:  Continue partial hospitalization programming Continue medications as directed  Treatment plan was reviewed and agreed upon by NP T. 07/24/2020 and patient Jared Cannon need for continued group services.    Jared Sauger, NP 08/05/2020, 11:14 AM

## 2020-08-05 NOTE — Therapy (Signed)
[facility] PARTIAL HOSPITALIZATION PROGRAM [address] [place], [place], [address] Phone: [phone]   Fax:  [phone] Virtual Visit via Video Note  I connected with Jared Cannon on 08/05/20 at  11:00 AM EST by a video enabled telemedicine application and verified that I am speaking with the correct person using two identifiers.  Location: Patient: Patient Home Provider: Clinic Office   I discussed the limitations of evaluation and management by telemedicine and the availability of in person appointments. The patient expressed understanding and agreed to proceed.   I discussed the assessment and treatment plan with the patient. The patient was provided an opportunity to ask questions and all were answered. The patient agreed with the plan and demonstrated an understanding of the instructions.   The patient was advised to call back or seek an in-person evaluation if the symptoms worsen or if the condition fails to improve as anticipated.  I provided 60 minutes of non-face-to-face time during this encounter.   Donne Hazel, OT   Occupational Therapy Treatment  Patient Details  Name: Jared Cannon MRN: [id number] Date of Birth: 1971-01-01 Referring Provider (OT): Hillery Jacks   Encounter Date: 08/05/2020   OT End of Session - 08/05/20 1438    Visit Number 10    Number of Visits 20    Date for OT Re-Evaluation 08/17/20    Authorization Type [employer] 25.00 copay, 30 vist limit    OT Start Time 1100    OT Stop Time 1200    OT Time Calculation (min) 60 min    Activity Tolerance Patient tolerated treatment well    Behavior During Therapy Anxious           Past Medical History:  Diagnosis Date  . Heartburn   . OCD (obsessive compulsive disorder)     History reviewed. No pertinent surgical history.  There were no vitals filed for this visit.   Subjective Assessment - 08/05/20 1438    Currently in Pain? No/denies           OT  Education - 08/05/20 1438    Education Details Educated on physical symptomology of stress and its effects on the body, along with positive stress management tips/strategies    Person(s) Educated Patient    Methods Explanation;Handout    Comprehension Verbalized understanding            OT Short Term Goals - 07/21/20 1258      OT SHORT TERM GOAL #1   Status On-going      OT SHORT TERM GOAL #2   Status On-going      OT SHORT TERM GOAL #3   Status On-going         Group Session:  S: "My mom and I had a really bad fight and she is going to tell me to leave, I know it."  O: Group began with a check-in and review of previous OT session focused on health and wellness. Today's discussion focused on the topic of stress management. Group members worked collaboratively to create a Microbiologist identifying physical signs, behavioral signs, emotional/psychological, and cognitive signs of stress. Discussion then focused and encouraged group members to identify positive stress management strategies they could utilize in those moments to manage identified signs.   A: Jared Cannon was active in his participation of today's session, however required frequent redirection due to tangential responses and ruminations. Pt was notably anxious and hyper verbal during session, ruminating on an argument he  had with his mother the night before. Pt expressed being extremely stressed out and stated "I know she is going to tell me to move out, she has said it before." Pt difficult to redirect, however somewhat receptive to clinician cues to engage appropriately in discussion and identify positive strategies to manage his identified stressor. Pt somewhat open to going for a walk, drive, screaming into a pillow, and walking away from the conversation.   P: Continue to attend PHP OT group sessions 5x week for 1 weeks to promote daily structure, social engagement, and opportunities to develop and utilize  adaptive strategies to maximize functional performance in preparation for safe transition and integration back into school, work, and the community.    Plan - 08/05/20 1438    Occupational performance deficits (Please refer to evaluation for details): ADL's;IADL's;Rest and Sleep;Education;Work;Social Participation;Leisure    Body Structure / Function / Physical Skills ADL    Cognitive Skills Attention;Consciousness;Emotional;Energy/Drive;Learn;Memory;Perception;Understand;Thought;Temperament/Personality;Safety Awareness;Problem Solve;Sequencing    Psychosocial Skills Coping Strategies;Environmental  Adaptations;Habits;Interpersonal Interaction;Routines and Behaviors           Patient will benefit from skilled therapeutic intervention in order to improve the following deficits and impairments:   Body Structure / Function / Physical Skills: ADL Cognitive Skills: Attention,Consciousness,Emotional,Energy/Drive,Learn,Memory,Perception,Understand,Thought,Temperament/Personality,Safety Awareness,Problem Solve,Sequencing Psychosocial Skills: Coping Strategies,Environmental  Adaptations,Habits,Interpersonal Interaction,Routines and Behaviors   Visit Diagnosis: Difficulty coping  Frontal lobe and executive function deficit  Obsessive-compulsive disorder, unspecified type    Problem List There are no problems to display for this patient.   08/05/2020  Donne Hazel, MOT, OTR/L  08/05/2020, 2:39 PM  [facility] PARTIAL HOSPITALIZATION PROGRAM [address] [place], [place], [address] Phone: [phone]   Fax:  [phone]  Name: Jared Cannon MRN: [id number] Date of Birth: 1970-12-16

## 2020-08-06 ENCOUNTER — Encounter (HOSPITAL_COMMUNITY): Payer: Self-pay | Admitting: Family

## 2020-08-07 NOTE — Psych (Signed)
Virtual Visit via Video Note  I connected with Jared Cannon on 07/28/20 at  9:00 AM EST by a video enabled telemedicine application and verified that I am speaking with the correct person using two identifiers.  Location: Patient: patient home Provider: clinical home office    I discussed the limitations of evaluation and management by telemedicine and the availability of in person appointments. The patient expressed understanding and agreed to proceed.  I discussed the assessment and treatment plan with the patient. The patient was provided an opportunity to ask questions and all were answered. The patient agreed with the plan and demonstrated an understanding of the instructions.   The patient was advised to call back or seek an in-person evaluation if the symptoms worsen or if the condition fails to improve as anticipated.  Pt was provided 240 minutes of non-face-to-face time during this encounter.   [name], LCSW    [facility] PHP THERAPIST PROGRESS NOTE  [name] [id number]  Session Time: 9:00 - 10:00  Participation Level: Active  Behavioral Response: CasualAlertAnxious and Depressed  Type of Therapy: Group Therapy  Treatment Goals addressed: Coping  Interventions: CBT, DBT, Supportive and Reframing  Summary: Clinician led check-in regarding current stressors and situation.Clinician utilized active listening and empathetic response and validated patient emotions. Clinician facilitated processing group on pertinent issues.   Therapist Response: Jared Cannon is a 50 y.o. male who presents with OCD and depression symptoms.  Patient arrived within time allowed and reports that he is feeling "stressed." Patient rates hismood at [facility] a scale of 1-10 with 10 being great. Pt reports he has been out of group due to OCD related struggles. Pt states his rituals have slowed him down in the morning and kept him up at night. Pt reports increased anxiety due to missing group  sessions and struggles with reassurance seeking. Pt reports some passive SI over the past few days and that he has been able to manage it. Pt able to process. Pt engaged in discussion.      Session Time: 10:00 - 11:00   Participation Level:Active  Behavioral Response:CasualAlertDepressed  Type of Therapy:GroupTherapy  Treatment Goals addressed: Coping  Interventions:CBT, DBT, Supportive and Reframing  Summary:Cln led discussion on support groups and the role they can provide in recovery and ongoing treatment. Group discussed success and barriers they have had with past support groups or they have with considering support groups. Cln provided resources for local support groups.   Therapist Response: Pt engaged in discussion and reports interest in engaging in support groups.       Session Time: 11:00- 12:00  Participation Level:Active  Behavioral Response:CasualAlertDepressed  Type of Therapy: Group Therapy, OT  Treatment Goals addressed: Coping  Interventions:Psychosocial skills training, Supportive  Summary:Occupational Therapy group  Therapist Response:Patient engaged in group. See OT note.      Session Time: 12:00 -1:00  Participation Level:Active  Behavioral Response:CasualAlertDepressed  Type of Therapy:Group therapy  Treatment Goals addressed: Coping  Interventions:CBT; Solution focused; Supportive; Reframing  Summary:12:00 - 12:50: Cln continued topic of DBT distress tolerance skills. Cln introduced the ACCEPTS distraction skills and group discusses ways to utilize A-C-C-E skills. 12:50 -1:00 Clinician led check-out. Clinician assessed for immediate needs, medication compliance and efficacy, and safety concerns  Therapist Response:12:00 - 12:50: Pt engaged in discussion and states he can thrift shop to practice the "A" skill.    12:50 - 1:00: At check-out, patientrates his mood at a8.5on a scale of  1-10 with 10 being great.Pt reports  afternoon plans ofgetting out of the house and making calls. Pt demonstrates limited progress as evidenced by continued lack of applying strategies. Patient denies SI/HI at the end of group.     Suicidal/Homicidal: Nowithout intent/plan  Plan: Pt will continue in PHP while working to decrease depression symptoms; decrease negative impact of OCD symptoms, and increase ability to manage symptoms in a healthy manner.   Diagnosis: Obsessive-compulsive disorder, unspecified type [F42.9]    1. Obsessive-compulsive disorder, unspecified type   2. MDD (major depressive disorder), recurrent episode, moderate (HCC)       [name], LCSW 08/07/2020

## 2020-08-07 NOTE — Psych (Signed)
Virtual Visit via Video Note  I connected with Jared Cannon on 08/02/20 at  9:00 AM EST by a video enabled telemedicine application and verified that I am speaking with the correct person using two identifiers.  Location: Patient: patient home Provider: clinical home office    I discussed the limitations of evaluation and management by telemedicine and the availability of in person appointments. The patient expressed understanding and agreed to proceed.  I discussed the assessment and treatment plan with the patient. The patient was provided an opportunity to ask questions and all were answered. The patient agreed with the plan and demonstrated an understanding of the instructions.   The patient was advised to call back or seek an in-person evaluation if the symptoms worsen or if the condition fails to improve as anticipated.  Pt was provided 240 minutes of non-face-to-face time during this encounter.   [name], LCSW    [facility] PHP THERAPIST PROGRESS NOTE  Jared Cannon [id number]  Session Time: 9:00 - 10:00  Participation Level: Active  Behavioral Response: CasualAlertAnxious and Depressed  Type of Therapy: Group Therapy  Treatment Goals addressed: Coping  Interventions: CBT, DBT, Supportive and Reframing  Summary: Clinician led check-in regarding current stressors and situation.Clinician utilized active listening and empathetic response and validated patient emotions. Clinician facilitated processing group on pertinent issues.   Therapist Response: Jared Cannon is a 51 y.o. male who presents with OCD and depression symptoms.  Patient arrived within time allowed and reports that he is feeling "tired." Patient rates hismood at a5.5on a scale of 1-10 with 10 being great. Pt reports yesterday he was out due to a psychiatry appointment and it went well. Pt states he stayed out of the house for most of the afternoon and tried to engage in positive events for himself. Pt states  he struggled more over the weekend and experienced obsessive and intrusive thoughts regarding a trigger with his brother. Pt identifies passive SI during this time and again napped to manage. Pt able to process. Pt engaged in discussion.      Session Time: 10:00 - 11:00   Participation Level:Active  Behavioral Response:CasualAlertDepressed  Type of Therapy:GroupTherapy  Treatment Goals addressed: Coping  Interventions:CBT, DBT, Supportive and Reframing  Summary:Cln led discussion on next steps. Group members voiced multiple anxieties regarding next steps in treatment and how that coordinates with next steps in their lives. Cln reviewed the levels of treatment and the step-down plans that can ease anxieties regarding preparations for being without a structured program. Cln utilized motivational interviewing to address barriers.   Therapist Response:  Pt engaged in discussion and shares anxieties regarding change and not knowing what the next month will look like.       Session Time: 11:00- 12:00  Participation Level:Active  Behavioral Response:CasualAlertDepressed  Type of Therapy: Group Therapy, OT  Treatment Goals addressed: Coping  Interventions:Psychosocial skills training, Supportive  Summary:Occupational Therapy group  Therapist Response:Patient engaged in group. See OT note.      Session Time: 12:00 -1:00  Participation Level:Active  Behavioral Response:CasualAlertDepressed  Type of Therapy:Group therapy  Treatment Goals addressed: Coping  Interventions:CBT; Solution focused; Supportive; Reframing  Summary:12:00 - 12:50: Cln led discussion on procrastination. Cln encouraged pt's to consider the feeling that is feeding procrastination and address it as a way of alleviating desire to procrastinate. Cln applied CBT thought challenging and DBT distress tolerance skills to aid discussion.  12:50 -1:00 Clinician led  check-out. Clinician assessed for immediate needs, medication compliance and efficacy,  and safety concerns  Therapist Response:12:00 - 12:50: Pt engaged in discussion and identifies feeling overwhelmed as the fuel to procrastination. 12:50 - 1:00: At check-out, patientrates his mood at a6 on a scale of 1-10 with 10 being great.Pt reports afternoon plans ofnapping and organizing his room. Pt demonstrates some progress as evidenced by beng able to not attach to some intrusive thoughts. Patient denies SI/HI at the end of group.     Suicidal/Homicidal: Nowithout intent/plan  Plan: Pt will continue in PHP while working to decrease depression symptoms; decrease negative impact of OCD symptoms, and increase ability to manage symptoms in a healthy manner.   Diagnosis: Obsessive-compulsive disorder, unspecified type [F42.9]    1. Obsessive-compulsive disorder, unspecified type   2. MDD (major depressive disorder), recurrent episode, moderate (HCC)       [name], LCSW 08/07/2020

## 2020-08-07 NOTE — Psych (Signed)
Virtual Visit via Video Note  I connected with Jared Cannon on 07/29/20 at  9:00 AM EST by a video enabled telemedicine application and verified that I am speaking with the correct person using two identifiers.  Location: Patient: patient home Provider: clinical home office    I discussed the limitations of evaluation and management by telemedicine and the availability of in person appointments. The patient expressed understanding and agreed to proceed.  I discussed the assessment and treatment plan with the patient. The patient was provided an opportunity to ask questions and all were answered. The patient agreed with the plan and demonstrated an understanding of the instructions.   The patient was advised to call back or seek an in-person evaluation if the symptoms worsen or if the condition fails to improve as anticipated.  Pt was provided 240 minutes of non-face-to-face time during this encounter.   [name], LCSW    [facility] PHP THERAPIST PROGRESS NOTE  Jabaree Mercado [id number]  Session Time: 9:00 - 10:00  Participation Level: Active  Behavioral Response: CasualAlertAnxious and Depressed  Type of Therapy: Group Therapy  Treatment Goals addressed: Coping  Interventions: CBT, DBT, Supportive and Reframing  Summary: Clinician led check-in regarding current stressors and situation.Clinician utilized active listening and empathetic response and validated patient emotions. Clinician facilitated processing group on pertinent issues.   Therapist Response: Jared Cannon is a 50 y.o. male who presents with OCD and depression symptoms.  Patient arrived within time allowed and reports that he is feeling "better today." Patient rates hismood at [facility] a scale of 1-10 with 10 being great. Pt reports yesterday afternoon was a struggle and he ruminated about the issue with his mom and "cried and cried." Pt states he took a nap to manage the feelings and woke up somewhat better. Pt  identifies passive SI and it was also alleviated by the nap. Pt able to process. Pt engaged in discussion.      Session Time: 10:00 - 11:00   Participation Level:Active  Behavioral Response:CasualAlertDepressed  Type of Therapy:GroupTherapy  Treatment Goals addressed: Coping  Interventions:CBT, DBT, Supportive and Reframing  Summary:Cln led discussion on rest. Cln discussed the need to rewrite social story of rest being earned or last on the list and assert that rest is productive. Group members discussed barriers to allowing themselves rest and the negative self-talk involved.  Cln worked with group to thought challenge and apply self-coaching strategies.  Therapist Response: Pt engaged in discussion and reports struggle with rest in terms of decompressing, not in terms of sleep.       Session Time: 11:00- 12:00  Participation Level:Active  Behavioral Response:CasualAlertDepressed  Type of Therapy: Group Therapy, OT  Treatment Goals addressed: Coping  Interventions:Psychosocial skills training, Supportive  Summary:Occupational Therapy group  Therapist Response:Patient engaged in group. See OT note.      Session Time: 12:00 -1:00  Participation Level:Active  Behavioral Response:CasualAlertDepressed  Type of Therapy:Group therapy  Treatment Goals addressed: Coping  Interventions:CBT; Solution focused; Supportive; Reframing  Summary:12:00 - 12:50: Cln continued topic of DBT distress tolerance skills and the ACCEPTS distraction skill. Group reviewed P-T-S skills and discussed how they can practice them in their every day life.  12:50 -1:00 Clinician led check-out. Clinician assessed for immediate needs, medication compliance and efficacy, and safety concerns  Therapist Response:12:00 - 12:50: Pt engaged in discussion and reports he is most likely to practice the "T" skill.   12:50 - 1:00: At check-out,  patientrates his mood at a6 on a  scale of 1-10 with 10 being great.Pt reports afternoon plans ofdoing errands from yesterday. Pt demonstrates some progress as evidenced by self-managing SI. Patient denies SI/HI at the end of group.     Suicidal/Homicidal: Nowithout intent/plan  Plan: Pt will continue in PHP while working to decrease depression symptoms; decrease negative impact of OCD symptoms, and increase ability to manage symptoms in a healthy manner.   Diagnosis: Obsessive-compulsive disorder, unspecified type [F42.9]    1. Obsessive-compulsive disorder, unspecified type   2. MDD (major depressive disorder), recurrent episode, moderate (HCC)       [name], LCSW 08/07/2020

## 2020-08-07 NOTE — Psych (Signed)
Virtual Visit via Video Note  I connected with Jared Cannon on 08/03/20 at  9:00 AM EST by a video enabled telemedicine application and verified that I am speaking with the correct person using two identifiers.  Location: Patient: patient home Provider: clinical home office    I discussed the limitations of evaluation and management by telemedicine and the availability of in person appointments. The patient expressed understanding and agreed to proceed.  I discussed the assessment and treatment plan with the patient. The patient was provided an opportunity to ask questions and all were answered. The patient agreed with the plan and demonstrated an understanding of the instructions.   The patient was advised to call back or seek an in-person evaluation if the symptoms worsen or if the condition fails to improve as anticipated.  Pt was provided 240 minutes of non-face-to-face time during this encounter.   [name], LCSW    [facility] PHP THERAPIST PROGRESS NOTE  Tahmir Cannon [id number]  Session Time: 9:00 - 10:00  Participation Level: Active  Behavioral Response: CasualAlertAnxious and Depressed  Type of Therapy: Group Therapy  Treatment Goals addressed: Coping  Interventions: CBT, DBT, Supportive and Reframing  Summary: Clinician led check-in regarding current stressors and situation.Clinician utilized active listening and empathetic response and validated patient emotions. Clinician facilitated processing group on pertinent issues.   Therapist Response: Terrel Nesheiwat is a 50 y.o. male who presents with OCD and depression symptoms.  Patient arrived within time allowed and reports that he is feeling "okay." Patient rates hismood at [facility] a scale of 1-10 with 10 being great. Pt reports he has been "really really tired" and is sleeping a normal night's sleep and napping 4 hours. Pt is unsure why he is so tired however states he is not napping to escape. Pt states he put off his  to-do list because of his nap. Pt continues to struggle with obsessions and rituals. Pt able to process. Pt engaged in discussion.      Session Time: 10:00 - 11:00   Participation Level:Active  Behavioral Response:CasualAlertDepressed  Type of Therapy:GroupTherapy  Treatment Goals addressed: Coping  Interventions:CBT, DBT, Supportive and Reframing  Summary:Cln introduced DBT concept of radical acceptance. Cln discussed radical acceptance as a strategy to decrease distress and to manage situations outside of their control. Group volunteered struggles they are experiencing with control and cln helped group apply radical acceptance.   Therapist Response:Pt engaged in discussion and identifies he can apply radical acceptance to a past action which he ruminates on.      Session Time: 11:00- 12:00  Participation Level:Active  Behavioral Response:CasualAlertDepressed  Type of Therapy:Group therapy  Treatment Goals addressed: Coping  Interventions:Supportive, Reframing  Summary:Spiritual Care group  Therapist Response:Pt engaged in session. See chaplain note      Session Time: 12:00 -1:00  Participation Level:Active  Behavioral Response:CasualAlertDepressed  Type of Therapy:Group therapy  Treatment Goals addressed: Coping  Interventions:Psychosocial skills training, Supportive  Summary:12:00 - 12:50:Occupational Therapy group 12:50 -1:00 Clinician led check-out. Clinician assessed for immediate needs, medication compliance and efficacy, and safety concerns  Therapist Response:12:00 - 12:50:Patient engaged in group. See OT note. 12:50 - 1:00: At check-out, patientrates his mood at a6.5 on a scale of 1-10 with 10 being great.Pt reports afternoon plans ofdoing errands, make a call he has been putting off for a week, and get out of the house. Pt demonstrates some progress as evidenced by improved mood. Patient  denies SI/HI at the end of group.  Suicidal/Homicidal: Nowithout intent/plan  Plan: Pt will continue in PHP while working to decrease depression symptoms; decrease negative impact of OCD symptoms, and increase ability to manage symptoms in a healthy manner.   Diagnosis: Obsessive-compulsive disorder, unspecified type [F42.9]    1. Obsessive-compulsive disorder, unspecified type   2. MDD (major depressive disorder), recurrent episode, moderate (HCC)       [name], LCSW 08/07/2020

## 2020-08-08 ENCOUNTER — Telehealth (HOSPITAL_COMMUNITY): Payer: Self-pay | Admitting: Psychiatry

## 2020-08-08 ENCOUNTER — Other Ambulatory Visit (HOSPITAL_COMMUNITY): Payer: 59 | Admitting: Licensed Clinical Social Worker

## 2020-08-08 ENCOUNTER — Other Ambulatory Visit (HOSPITAL_COMMUNITY): Payer: 59 | Admitting: Occupational Therapy

## 2020-08-08 ENCOUNTER — Encounter (HOSPITAL_COMMUNITY): Payer: Self-pay

## 2020-08-08 ENCOUNTER — Other Ambulatory Visit: Payer: Self-pay

## 2020-08-08 DIAGNOSIS — F429 Obsessive-compulsive disorder, unspecified: Secondary | ICD-10-CM

## 2020-08-08 DIAGNOSIS — F331 Major depressive disorder, recurrent, moderate: Secondary | ICD-10-CM

## 2020-08-08 DIAGNOSIS — R4589 Other symptoms and signs involving emotional state: Secondary | ICD-10-CM

## 2020-08-08 DIAGNOSIS — R41844 Frontal lobe and executive function deficit: Secondary | ICD-10-CM

## 2020-08-08 NOTE — Progress Notes (Signed)
Virtual Visit via Video Note  I connected with Jared Cannon on 08/08/20 at  9:00 AM EST by a video enabled telemedicine application and verified that I am speaking with the correct person using two identifiers.  Location: Patient: Home Provider:  Office   I discussed the limitations of evaluation and management by telemedicine and the availability of in person appointments. The patient expressed understanding and agreed to proceed.    I discussed the assessment and treatment plan with the patient. The patient was provided an opportunity to ask questions and all were answered. The patient agreed with the plan and demonstrated an understanding of the instructions.   The patient was advised to call back or seek an in-person evaluation if the symptoms worsen or if the condition fails to improve as anticipated.  I provided 15 minutes of non-face-to-face time during this encounter.   Jared Rack, NP   [facility] Program Discharge Summary  Jared Cannon [id number]  Admission date: 07/20/2020 Discharge date: 08/08/20  Reason for admission: Per admission assessment note: Jared Cannon is an 50 y.o. male with history of depression and OCD, seen outpatient by Dr. Donell Cannon. He is also seen for counseling by [facility]. He is pleasant but anxious on assessment. He reports increased depression and OCD symptoms recently and has taken a short term leave from work. He is working on getting into the [facility] residential treatment center for OCD but reports Dr. Donell Cannon had recommended PHP in the meantime. Dr. Donell Cannon also recently wrote a prescription for Paxil, which patient states he picked up today but has not started taking yet. He reports delaying PHP because he was unsure if his new insurance would cover it. He is unsure when his insurance will change but states he currently has insurance that covers PHP. He strongly denies any SI/HI/AVH. He shows no signs of  responding to internal stimuli  Family of Origin Issues: Ongoing, patient reports he continues to work on his relationship between he and his mother.  States he has been strained for period of time and since he had to move back in with her their relationship has her ups and downs.  Progress in Program Toward Treatment Goals: Ongoing, Jared Cannon attended and participated with daily group session with active and engaged participation.  Denies suicidal or homicidal ideations.  Patient reports he is hopeful and optimistic to follow-up with long-term treatment for his obsessive-compulsive disorder.  During partial hospitalization programming patient medication was titrated Paxil from 10 mg to 20 mg p.o. daily he reports taking and tolerating medications well.  Patient to stepdown to our intensive outpatient programming on 08/09/2020  Progress (rationale): Patient is stepping down to Intensive Outpatient Programming (IOP)  Take all medications as prescribed. Keep all follow-up appointments as scheduled.  Do not consume alcohol or use illegal drugs while on prescription medications. Report any adverse effects from your medications to your primary care provider promptly.  In the event of recurrent symptoms or worsening symptoms, call 911, a crisis hotline, or go to the nearest emergency department for evaluation.  Jared Rack, NP 08/08/2020

## 2020-08-08 NOTE — Therapy (Signed)
[name], [place], [address] Phone: [phone]   Fax:  [phone] Virtual Visit via Video Note  I connected with [name] on 08/08/20 at 11:00 AM EST by a video enabled telemedicine application and verified that I am speaking with the correct person using two identifiers.  Location: Patient: Patient Home Provider: Clinic Office   I discussed the limitations of evaluation and management by telemedicine and the availability of in person appointments. The patient expressed understanding and agreed to proceed.   I discussed the assessment and treatment plan with the patient. The patient was provided an opportunity to ask questions and all were answered. The patient agreed with the plan and demonstrated an understanding of the instructions.   The patient was advised to call back or seek an in-person evaluation if the symptoms worsen or if the condition fails to improve as anticipated.  I provided 60 minutes of non-face-to-face time during this encounter.   Ponciano Ort, OT   Occupational Therapy Treatment  Patient Details  Name: Jared Cannon MRN: [id number] Date of Birth: 1970-12-19 Referring Provider (OT): Ricky Ala   Encounter Date: 08/08/2020   OT End of Session - 08/08/20 1306    Visit Number 11    Number of Visits 20    Date for OT Re-Evaluation 08/17/20    Authorization Type [employer] 25.00 copay, 30 vist limit    OT Start Time 1100    OT Stop Time 1200    OT Time Calculation (min) 60 min    Activity Tolerance Patient tolerated treatment well    Behavior During Therapy [facility] for tasks assessed/performed;Restless           Past Medical History:  Diagnosis Date  . Heartburn   . OCD (obsessive compulsive disorder)     History reviewed. No pertinent surgical history.  There were no vitals filed for this visit.   Subjective Assessment - 08/08/20 1305    Currently in Pain?  No/denies           OT Education - 08/08/20 1305    Education Details Educated on low vs positive self-esteem and reviewed six strategies to boost low self-esteem    Person(s) Educated Patient    Methods Explanation;Handout    Comprehension Verbalized understanding            OT Short Term Goals - 08/08/20 1306      OT SHORT TERM GOAL #1   Title Pt will actively engage in OT group sessions throughout duration of PHP programming, in order to promote daily structure, social engagement, and opportunities to develop and utilize adaptive strategies to maximize functional performance in preparation for safe transition and integration back into school, work, and the community.    Status Achieved      OT SHORT TERM GOAL #2   Title Pt will practice and identify 1-3 adaptive coping strategies he can utilize, in order to safely manage increased ruminations and OCD tendencies, with min cues, in preparation for safe and healthy reintegration back into the community at discharge.    Status Partially Met      OT SHORT TERM GOAL #3   Title Pt will identify 1-3 ways to structure his free time, in order to promote re-engagement in preferred leisure interests and establishment of a daily routine, in preparation for community reintegration.    Status Not Met         Group Session:  S: "I  think I take compliments well, I don't tell the person that I don't believe them."  O: Today's group session encouraged increased engagement and participation through discussion focused on self-esteem. Topic of discussion focused on identifying differences between low vs high self-esteem and identified ways in which our self-esteem impacts our daily lives including: self-criticism, ignoring positive qualities, negative emotions, work/school performance, relationships, leisure engagement, and self-care. Group members further identified ways in which their self-esteem directly impacts their daily function. Tips and  strategies were shared to boost and build-up our low self-esteem, including exercise, mindfulness/meditation, postures of confidence, surrounding yourself with positive people, positive affirmations, and self-care. Group members committed to trying one of the above-mentioned strategies over their weekend.    A: Nicholis was active in his participation of today's session, however continues to present as restless, at times tangential and difficult to re-direct at times. Pt would frequently interrupt this clinician and apologize, however had difficulty following along at group pace and responding appropriately to discussion. Pt identified having low self-esteem, however noted that no one around him notices because he does not allow them too, and shared that he thanks them for their compliments, even when he does not believe it himself. Pt appeared relatively receptive to education and information received on improving self-esteem, asking additional questions surrounding mindfulness.   P: Pt will discharge from PHP, effective today, 08/08/2020, with plan to follow up and begin [facility] tomorrow 08/09/2020. Pt encouraged to follow up with individual therapist and PCP for ongoing treatment and therapy needs post discharge from [facility].  OCCUPATIONAL THERAPY DISCHARGE SUMMARY  Visits from Start of Care: 11  Current functional level related to goals / functional outcomes: Patient has met 1/3 OT Goals within the [facility] program, and has made partial progress in 1/3 goals. Encourage patient to continue working on goals as he discharges and transitions to [facility]. Pt ready for discharge from PHP effective today 08/08/2020.   Remaining deficits: See above   Education / Equipment: See above   Plan: Patient agrees to discharge.  Patient goals were not met. Patient is being discharged due to meeting the stated rehab goals.  ?????        Plan - 08/08/20 1306    Occupational performance deficits (Please refer to  evaluation for details): ADL's;IADL's;Rest and Sleep;Education;Work;Social Participation;Leisure    Body Structure / Function / Physical Skills ADL    Cognitive Skills Attention;Consciousness;Emotional;Energy/Drive;Learn;Memory;Perception;Understand;Thought;Temperament/Personality;Safety Awareness;Problem Solve;Sequencing    Psychosocial Skills Coping Strategies;Environmental  Adaptations;Habits;Interpersonal Interaction;Routines and Behaviors           Patient will benefit from skilled therapeutic intervention in order to improve the following deficits and impairments:   Body Structure / Function / Physical Skills: ADL Cognitive Skills: Attention,Consciousness,Emotional,Energy/Drive,Learn,Memory,Perception,Understand,Thought,Temperament/Personality,Safety Awareness,Problem Solve,Sequencing Psychosocial Skills: Coping Strategies,Environmental  Adaptations,Habits,Interpersonal Interaction,Routines and Behaviors   Visit Diagnosis: Difficulty coping  Frontal lobe and executive function deficit  Obsessive-compulsive disorder, unspecified type    Problem List There are no problems to display for this patient.   08/08/2020  Ponciano Ort, MOT, OTR/L  08/08/2020, 1:07 PM  [facility] HOSPITALIZATION PROGRAM [place], [place], [address] Phone: [phone]   Fax:  [phone]  Name: Erin Uecker MRN: [id number] Date of Birth: 1971/03/25

## 2020-08-08 NOTE — Telephone Encounter (Signed)
D:  Patient will transitioning from [facility] to MH-IOP tomorrow.  A:  Placed call to introduce self/orient patient and to answer his questions.  R:  Pt receptive.

## 2020-08-09 ENCOUNTER — Other Ambulatory Visit (HOSPITAL_COMMUNITY): Payer: 59 | Admitting: Psychiatry

## 2020-08-09 ENCOUNTER — Encounter (HOSPITAL_COMMUNITY): Payer: Self-pay | Admitting: Psychiatry

## 2020-08-09 ENCOUNTER — Ambulatory Visit (HOSPITAL_COMMUNITY): Payer: 59

## 2020-08-09 ENCOUNTER — Other Ambulatory Visit: Payer: Self-pay

## 2020-08-09 ENCOUNTER — Encounter (HOSPITAL_COMMUNITY): Payer: Self-pay | Admitting: Family

## 2020-08-09 ENCOUNTER — Other Ambulatory Visit (HOSPITAL_COMMUNITY): Payer: 59

## 2020-08-09 DIAGNOSIS — F331 Major depressive disorder, recurrent, moderate: Secondary | ICD-10-CM

## 2020-08-09 DIAGNOSIS — F429 Obsessive-compulsive disorder, unspecified: Secondary | ICD-10-CM | POA: Diagnosis not present

## 2020-08-09 NOTE — Progress Notes (Signed)
Virtual Visit via Video Note  I connected with Jared Cannon on 08/09/20 at  9:00 AM EST by a video enabled telemedicine application and verified that I am speaking with the correct person using two identifiers.  At orientation to the IOP program, Case Manager discussed the limitations of evaluation and management by telemedicine and the availability of in person appointments. The patient expressed understanding and agreed to proceed with virtual visits throughout the duration of the program.   Location:  Patient: Patient Home Provider: Home Office   History of Present Illness: OCD and MDD  Observations/Objective: Check In: Case Manager checked in with all participants to review discharge dates, insurance authorizations, work-related documents and needs from the treatment team. Client stated needs and engaged in discussion. Case Manager welcomed 2 new group members, with group introducing self and starting the joining process.   Initial Therapeutic Activity: Counselor facilitated therapeutic processing with group members to assess mood and current functioning, prompting group members to share about application of skills, progress and challenges in treatment/personal lives. Client reports challenges with managing OCD and desire to complete specialized treatment after gaining coping skills in this program. Client shared about support system. Client presents with mdoerate depression and severe anxiety. Client denied any current SI/HI/psychosis.  Second Therapeutic Activity: Counselor closed program by allowing time to celebrate a graduating group member. Counselor shared reflections on progress and allow space for group members to share well wishes and appreciates to the graduating client. Counselor prompted graduating client to share takeaways, reflect on progress and final thoughts for the group.  Check Out: Counselor prompted group members to identify one self-care practice or productivity activity  they would like to engage in today. Client plans to spend time outside to get fresh air. Client endorsed safety plan to be followed to prevent safety issues.  Assessment and Plan: Clinician recommends that Client remain in IOP treatment to better manage mental health symptoms, stabilization and to address treatment plan goals. Clinician recommends adherence to crisis/safety plan, taking medications as prescribed, and following up with medical professionals if any issues arise.   Follow Up Instructions: Clinician will send Webex link for next session. The Client was advised to call back or seek an in-person evaluation if the symptoms worsen or if the condition fails to improve as anticipated.     I provided 180 minutes of non-face-to-face time during this encounter.     Hilbert Odor, LCSW

## 2020-08-09 NOTE — Progress Notes (Signed)
Virtual Visit via Video Note  I connected with [name] on 08/09/20 at  9:00 AM EST by a video enabled telemedicine application and verified that I am speaking with the correct person using two identifiers.  Location: Patient: Home Provider: Office   I discussed the limitations of evaluation and management by telemedicine and the availability of in person appointments. The patient expressed understanding and agreed to proceed.   I discussed the assessment and treatment plan with the patient. The patient was provided an opportunity to ask questions and all were answered. The patient agreed with the plan and demonstrated an understanding of the instructions.   The patient was advised to call back or seek an in-person evaluation if the symptoms worsen or if the condition fails to improve as anticipated.  I provided 15 minutes of non-face-to-face time during this encounter.   Oneta Rack, NP    Psychiatric Initial Adult Assessment   Patient Identification: Alexzavier Girardin MRN:  [id number] Date of Evaluation:  08/09/2020 Referral Source: PHP Chief Complaint:  OCD, MDD and GAD Visit Diagnosis: No diagnosis found.  History of Present Illness:  Caster Fayette was seen and evaluated via WebEx.  He recently completed partial hospitalization programming (PHP) and and starting intensive outpatient programming (IOP) on 08/09/2020.  Continues to deny suicidal or homicidal ideations.  Declined any medication refills as he reports he recently followed up with his attending psychiatrist [place].  Does endorse ongoing ruminations with obsessive-compulsive disorder.  States he is hopeful to attend long-term treatment facility to improve his thoughts and behaviors.   Per admission assessment note-Nadeem Pharo  is a 50 y.o. Caucasian male presents with worsening anxiety related to his OCD symptoms.  Reports he is currently followed by psychiatry and therapy.  States he was taking Lexapro for a while however  medication has been ineffective which she was recently started on Paxil.  Patient reports his depression and anxiety has become the dominant stressor and has interfered work life balance.  Does admit to passive suicidal ideations.  Denies plan or intent. "  I have obsessed over how I would do it, but currently do not have a plan."  Reported inpatient admission in early 20s.  States he has been diagnosed with obsessive-compulsive disorder since the age of 30. Reported rituals and obsessions are sometimes " paralyzing"  Marsha reported " I get flustered, stressed and overwhelmed easily."  States" it is a vicious cycle," as patient reports ongoing ruminations and mild paranoia.  Associated Signs/Symptoms: Depression Symptoms:  insomnia, difficulty concentrating, anxiety, (Hypo) Manic Symptoms:  Distractibility, Irritable Mood, Anxiety Symptoms:  Excessive Worry, Psychotic Symptoms:  Hallucinations: None PTSD Symptoms: NA  Past Psychiatric History:   Previous Psychotropic Medications: Yes   Substance Abuse History in the last 12 months:  No.  Consequences of Substance Abuse: NA  Past Medical History:  Past Medical History:  Diagnosis Date  . Heartburn   . OCD (obsessive compulsive disorder)    No past surgical history on file.  Family Psychiatric History:   Family History:  Family History  Problem Relation Age of Onset  . Alcohol abuse Paternal Grandfather     Social History:   Social History   Socioeconomic History  . Marital status: Single    Spouse name: Not on file  . Number of children: 0  . Years of education: Not on file  . Highest education level: Bachelor's degree (e.g., BA, AB, BS)  Occupational History  . Not on file  Tobacco Use  .  Smoking status: Never Smoker  . Smokeless tobacco: Never Used  Vaping Use  . Vaping Use: Never used  Substance and Sexual Activity  . Alcohol use: Yes    Comment: occ  . Drug use: No  . Sexual activity: Not on file  Other  Topics Concern  . Not on file  Social History Narrative  . Not on file   Social Determinants of Health   Financial Resource Strain: Not on file  Food Insecurity: Not on file  Transportation Needs: Not on file  Physical Activity: Not on file  Stress: Not on file  Social Connections: Not on file    Additional Social History:   Allergies:  No Known Allergies  Metabolic Disorder Labs: No results found for: HGBA1C, MPG No results found for: PROLACTIN No results found for: CHOL, TRIG, HDL, CHOLHDL, VLDL, LDLCALC No results found for: TSH  Therapeutic Level Labs: No results found for: LITHIUM No results found for: CBMZ No results found for: VALPROATE  Current Medications: Current Outpatient Medications  Medication Sig Dispense Refill  . ALPRAZolam (XANAX) 1 MG tablet Take 1 mg by mouth 3 (three) times daily as needed.    . clomiPRAMINE (ANAFRANIL) 50 MG capsule Take 50 mg by mouth at bedtime. Take 4 capsules 200mg  qhs    . modafinil (PROVIGIL) 100 MG tablet Take 100 mg by mouth daily.    omeprazole (PRILOSEC) 10 MG capsule Take 10 mg by mouth as needed.    Marland Kitchen oxyCODONE-acetaminophen (PERCOCET/ROXICET) 5-325 MG per tablet Take 1-2 tablets by mouth every 6 (six) hours as needed for severe pain. (Patient not taking: No sig reported) 20 tablet 0  . PARoxetine (PAXIL) 20 MG tablet Take 1 tablet (20 mg total) by mouth daily. 30 tablet 0  . risperiDONE (RISPERDAL) 0.5 MG tablet Take 0.5 mg by mouth at bedtime.     No current facility-administered medications for this visit.    Musculoskeletal: Strength & Muscle Tone: within normal limits Gait & Station: normal Patient leans: N/A  Psychiatric Specialty Exam: Review of Systems  There were no vitals taken for this visit.There is no height or weight on file to calculate BMI.  General Appearance: Casual  Eye Contact:  Good  Speech:  Clear and Coherent  Volume:  Normal  Mood:  Anxious and Depressed  Affect:  Congruent  Thought  Process:  Coherent  Orientation:  Full (Time, Place, and Person)  Thought Content:  WDL  Suicidal Thoughts:  No  Homicidal Thoughts:  No  Memory:  Immediate;   Good Recent;   Good  Judgement:  Good  Insight:  Good  Psychomotor Activity:  Normal  Concentration:  Concentration: Fair  Recall:  Good  Fund of Knowledge:Good  Language: Fair  Akathisia:  No  Handed:  Right  AIMS (if indicated):  Assets:  Communication Skills Desire for Improvement Physical Health  ADL's:  Intact  Cognition: WNL  Sleep:  Good   Screenings: PHQ2-9   Flowsheet Row Counselor from 07/22/2020 in BEHAVIORAL [facility] PARTIAL HOSPITALIZATION PROGRAM  PHQ-2 Total Score 3  PHQ-9 Total Score 10      Assessment and Plan:  Start Intensive Outpatient Programing  - Continue medication as directed   Treatment plan was reviewed and agreed upon by NP T. 07/24/2020 and patient Mckinley Olheiser need for group services.     [name], NP 2/15/202211:45 AM

## 2020-08-09 NOTE — Progress Notes (Signed)
Virtual Visit via Telephone Note  I connected with Jared Cannon on @TODAY @ at  9:00 AM EST by telephone and verified that I am speaking with the correct person using two identifiers.  Location: Patient: at home Provider: at office   I discussed the limitations, risks, security and privacy concerns of performing an evaluation and management service by telephone and the availability of in person appointments. I also discussed with the patient that there may be a patient responsible charge related to this service. The patient expressed understanding and agreed to proceed.  I discussed the assessment and treatment plan with the patient. The patient was provided an opportunity to ask questions and all were answered. The patient agreed with the plan and demonstrated an understanding of the instructions.   The patient was advised to call back or seek an in-person evaluation if the symptoms worsen or if the condition fails to improve as anticipated.  I provided 30 minutes of non-face-to-face time during this encounter.   , M.Ed,CNA   Patient ID: Jared Cannon, male   DOB: 28-Sep-1970, 50 y.o.   MRN: [id number] As per previous CCA states:  Pt presents as referral to PHP per his psychiatrist, Dr [id number]. Pt reports history of OCD and depression diagnoses. Pt states he has been diagnosed with OCD since age 81 and that it has been getting worse over the past year, and even more over the past 3 months. Pt states he has been unable to work for the last 3 months due to increase in his ritualistic behaviors and obsessions. Pt reports decline in ADL's and goal oriented tasks. Pt reports experiencing SI over the past few weeks and reports current intermittent passive SI and hopelessness. Pt denies AVH and substance use. Pt states he is exploring residential treatment at a facility that specializes in OCD however there is a 2 month waiting list. Current Symptoms/Problems: Pt reports ritualistic behaviors,  assurance seeking, obsessive thoughts, depressed mood, hoplessness, and anxiety.  Pt transitioned from PHP to MH-IOP today.  Reports that PHP went well.  "I am looking for a new therapist that has more experience with OCD.  On a scale of 1-10 (10 being the worst); pt rates his depression a 3 and anxiety a 3.  Denies SI/HI or A/V hallucinations.   A:  Oriented pt to virtual MH-IOP.  Pt gave verbal consent for treatment, to release chart information to referred providers and to complete any forms if needed.  Pt also gave consent for attending group virtually d/t COVID-19 social distancing restrictions.  Encouraged support groups through [facility].  F/U with Dr. 25 and therapist of choice.  R:  Pt receptive.  Donell Beers, M.Ed, CNA

## 2020-08-10 ENCOUNTER — Encounter (HOSPITAL_COMMUNITY): Payer: Self-pay

## 2020-08-10 ENCOUNTER — Other Ambulatory Visit: Payer: Self-pay

## 2020-08-10 ENCOUNTER — Other Ambulatory Visit (HOSPITAL_COMMUNITY): Payer: 59 | Admitting: Psychiatry

## 2020-08-10 DIAGNOSIS — F331 Major depressive disorder, recurrent, moderate: Secondary | ICD-10-CM

## 2020-08-10 DIAGNOSIS — F429 Obsessive-compulsive disorder, unspecified: Secondary | ICD-10-CM | POA: Diagnosis not present

## 2020-08-10 NOTE — Progress Notes (Signed)
Virtual Visit via Video Note  I connected with Jared Cannon on 08/10/20 at  9:00 AM EST by a video enabled telemedicine application and verified that I am speaking with the correct person using two identifiers.  At orientation to the IOP program, Case Manager discussed the limitations of evaluation and management by telemedicine and the availability of in person appointments. The patient expressed understanding and agreed to proceed with virtual visits throughout the duration of the program.   Location:  Patient: Patient Home Provider: Home Office   History of Present Illness: MDD and OCD  Observations/Objective: Check In: Case Manager checked in with all participants to review discharge dates, insurance authorizations, work-related documents and needs from the treatment team. Client stated needs and engaged in discussion. Case Manager welcomed 2 new group members, with group introducing self and starting the joining process.   Initial Therapeutic Activity: Counselor facilitated therapeutic processing with group members to assess mood and current functioning, prompting group members to share about application of skills, progress and challenges in treatment/personal lives. Client shared about his experience with OCD and how it impacts his daily functioning, including onset of panic attack and obsessive thought processes. Client presents with mild depression and severe anxiety. Client denied any current SI/HI/psychosis.  Second Therapeutic Activity: Counselor introduced Chief Operating Officer, Clyda Greener, Director of Wellness to present information on [employer] and Benefits. Clients engaged in activities and discussion, personalizing the information to their individual circumstances. Client motivated to make small changes in wellness practices to improve overall mental health.  Check Out: Counselor closed program by allowing time to celebrate a graduating group member. Counselor shared reflections  on progress and allow space for group members to share well wishes and appreciates to the graduating client. Counselor prompted graduating client to share takeaways, reflect on progress and final thoughts for the group. Client endorsed safety plan to be followed to prevent safety issues.  Assessment and Plan: Clinician recommends that Client remain in IOP treatment to better manage mental health symptoms, stabilization and to address treatment plan goals. Clinician recommends adherence to crisis/safety plan, taking medications as prescribed, and following up with medical professionals if any issues arise.   Follow Up Instructions: Clinician will send Webex link for next session. The Client was advised to call back or seek an in-person evaluation if the symptoms worsen or if the condition fails to improve as anticipated.     I provided 180 minutes of non-face-to-face time during this encounter.

## 2020-08-11 ENCOUNTER — Ambulatory Visit (HOSPITAL_COMMUNITY): Payer: 59

## 2020-08-11 ENCOUNTER — Other Ambulatory Visit: Payer: Self-pay

## 2020-08-11 ENCOUNTER — Other Ambulatory Visit (HOSPITAL_COMMUNITY): Payer: 59 | Admitting: Psychiatry

## 2020-08-11 ENCOUNTER — Encounter (HOSPITAL_COMMUNITY): Payer: Self-pay

## 2020-08-11 DIAGNOSIS — F429 Obsessive-compulsive disorder, unspecified: Secondary | ICD-10-CM | POA: Diagnosis not present

## 2020-08-11 DIAGNOSIS — F331 Major depressive disorder, recurrent, moderate: Secondary | ICD-10-CM

## 2020-08-11 NOTE — Progress Notes (Signed)
Virtual Visit via Video Note  I connected with Jared Cannon on 08/11/20 at  9:00 AM EST by a video enabled telemedicine application and verified that I am speaking with the correct person using two identifiers.  At orientation to the IOP program, Case Manager discussed the limitations of evaluation and management by telemedicine and the availability of in person appointments. The patient expressed understanding and agreed to proceed with virtual visits throughout the duration of the program.   Location:  Patient: Patient Home Provider: Home Office   History of Present Illness: MDD and OCD  Observations/Objective: Check In: Case Manager checked in with all participants to review discharge dates, insurance authorizations, work-related documents and needs from the treatment team. Client stated needs and engaged in discussion. Case Manager welcomed a new group member, with group introducing self and starting the joining process.   Initial Therapeutic Activity: Counselor facilitated therapeutic processing with group members to assess mood and current functioning, prompting group members to share about application of skills, progress and challenges in treatment/personal lives. Client reports that he would like to improve mental health symptoms in order to work in [employer] again. Client discussed financial and housing goals. Client presents with moderate depression and moderate anxiety. Client denied any current SI/HI/psychosis.  Second Therapeutic Activity: Counselor introduced [place], [place] Chaplain to present information and discussion on Grief and Loss. Group members engaged in discussion, sharing how grief impacts them, what comforts them, what emotions are felt, labeling losses, etc. After guest speaker logged off, Counselor prompted group to spend 10-15 minutes journaling to process personal grief and loss situations. Counselor processed entries with group and client's  identified areas for additional processing in individual therapy. Client became fixated on the belief that he was grieving incorrectly. Presenter and Counselor redirected him to more factual messages to focus on to stop thought loop.  Check Out: Counselor closed program by allowing time to celebrate a graduating group member. Counselor shared reflections on progress and allow space for group members to share well wishes and appreciates to the graduating client. Counselor prompted graduating client to share takeaways, reflect on progress and final thoughts for the group. Client endorsed safety plan to be followed to prevent safety issues.  Assessment and Plan: Clinician recommends that Client remain in IOP treatment to better manage mental health symptoms, stabilization and to address treatment plan goals. Clinician recommends adherence to crisis/safety plan, taking medications as prescribed, and following up with medical professionals if any issues arise.   Follow Up Instructions: Clinician will send Webex link for next session. The Client was advised to call back or seek an in-person evaluation if the symptoms worsen or if the condition fails to improve as anticipated.     I provided 180 minutes of non-face-to-face time during this encounter.     Hilbert Odor, LCSW

## 2020-08-12 ENCOUNTER — Other Ambulatory Visit: Payer: Self-pay

## 2020-08-12 ENCOUNTER — Other Ambulatory Visit (HOSPITAL_COMMUNITY): Payer: 59 | Admitting: Psychiatry

## 2020-08-12 ENCOUNTER — Encounter (HOSPITAL_COMMUNITY): Payer: Self-pay | Admitting: Psychiatry

## 2020-08-12 DIAGNOSIS — F331 Major depressive disorder, recurrent, moderate: Secondary | ICD-10-CM

## 2020-08-12 DIAGNOSIS — F429 Obsessive-compulsive disorder, unspecified: Secondary | ICD-10-CM

## 2020-08-12 NOTE — Progress Notes (Signed)
Virtual Visit via Video Note  I connected with Jared Cannon on 08/12/20 at  9:00 AM EST by a video enabled telemedicine application and verified that I am speaking with the correct person using two identifiers.  At orientation to the IOP program, Case Manager discussed the limitations of evaluation and management by telemedicine and the availability of in person appointments. The patient expressed understanding and agreed to proceed with virtual visits throughout the duration of the program.   Location:  Patient: Patient Home Provider: Home Office   History of Present Illness: OCD and MDD  Observations/Objective: Check In: Case Manager checked in with all participants to review discharge dates, insurance authorizations, work-related documents and needs from the treatment team. Client stated needs and engaged in discussion.   Initial Therapeutic Activity: Counselor facilitated therapeutic processing with group members to assess mood and current functioning, prompting group members to share about application of skills, progress and challenges in treatment/personal lives. Client reports that he slept most of the day yesterday, feeling avoidant and unproductive. Client made a plan to be more active and engaged over the weekend. Client presents with moderate depression and moderate anxiety. Client denied any current SI/HI/psychosis.  Second Therapeutic Activity: Counselor facilitated the creation of a Mental Health Crisis and Safety Plan with group members. Counselor prompted group members to identify warning signs, coping skills, distractions, support people, providers and crisis numbers and safety considerations. Group members completed form and shared their responses.   Check Out: Client prompted group members to share one or more motivators for living. Client named his family as their primary reason for living and staying safe. Client endorsed safety plan to be followed to prevent safety  issues.  Assessment and Plan: Clinician recommends that Client remain in IOP treatment to better manage mental health symptoms, stabilization and to address treatment plan goals. Clinician recommends adherence to crisis/safety plan, taking medications as prescribed, and following up with medical professionals if any issues arise.   Follow Up Instructions: Clinician will send Webex link for next session. The Client was advised to call back or seek an in-person evaluation if the symptoms worsen or if the condition fails to improve as anticipated.     I provided 180 minutes of non-face-to-face time during this encounter.     Hilbert Odor, LCSW

## 2020-08-15 ENCOUNTER — Other Ambulatory Visit: Payer: Self-pay

## 2020-08-15 ENCOUNTER — Other Ambulatory Visit (HOSPITAL_COMMUNITY): Payer: 59 | Admitting: Psychiatry

## 2020-08-15 DIAGNOSIS — F331 Major depressive disorder, recurrent, moderate: Secondary | ICD-10-CM

## 2020-08-15 DIAGNOSIS — F429 Obsessive-compulsive disorder, unspecified: Secondary | ICD-10-CM | POA: Diagnosis not present

## 2020-08-15 NOTE — Progress Notes (Signed)
Virtual Visit via Video Note  I connected with [name] on 08/15/20 at  9:00 AM EST by a video enabled telemedicine application and verified that I am speaking with the correct person using two identifiers.  At orientation to the IOP program, Case Manager discussed the limitations of evaluation and management by telemedicine and the availability of in person appointments. The patient expressed understanding and agreed to proceed with virtual visits throughout the duration of the program.   Location:  Patient: Patient Home Provider: Home Office   History of Present Illness: MDD  Observations/Objective: Check In: Case Manager checked in with all participants to review discharge dates, insurance authorizations, work-related documents and needs from the treatment team. Client stated needs and engaged in discussion.   Initial Therapeutic Activity: Counselor facilitated therapeutic processing with group members to assess mood and current functioning, prompting group members to share about application of skills, progress and challenges in treatment/personal lives. Client reports that he is having difficulties managing a specific obsession and compulsion to confess. Counselor provided coping strategies for Client to consider in alleviation of compulsion. Client presents with moderate depression and moderate anxiety. Client denied any current SI/HI/psychosis.  Second Therapeutic Activity: Counselor facilitated the discussion around "How we think about our thoughts", before providing psychoeducation on Cognitive Distortions. Counselor provided a video outlining 15 common Styles of Distorted Thinking, prompting group members to take note of those they resonate with. Group engaged in discussion, sharing specific examples of thought processes.   Check Out: Client prompted group members to share one productivity activity or one self-care practice they can engage in today. Client plans to use distraction  methods to address OCD symptoms. Client endorsed safety plan to be followed to prevent safety issues.  Assessment and Plan: Clinician recommends that Client remain in IOP treatment to better manage mental health symptoms, stabilization and to address treatment plan goals. Clinician recommends adherence to crisis/safety plan, taking medications as prescribed, and following up with medical professionals if any issues arise.   Follow Up Instructions: Clinician will send Webex link for next session. The Client was advised to call back or seek an in-person evaluation if the symptoms worsen or if the condition fails to improve as anticipated.     I provided 180 minutes of non-face-to-face time during this encounter.     Hilbert Odor, LCSW

## 2020-08-16 ENCOUNTER — Other Ambulatory Visit (HOSPITAL_COMMUNITY): Payer: 59 | Admitting: Psychiatry

## 2020-08-16 ENCOUNTER — Other Ambulatory Visit: Payer: Self-pay

## 2020-08-16 ENCOUNTER — Encounter (HOSPITAL_COMMUNITY): Payer: Self-pay

## 2020-08-16 DIAGNOSIS — F331 Major depressive disorder, recurrent, moderate: Secondary | ICD-10-CM

## 2020-08-16 DIAGNOSIS — F429 Obsessive-compulsive disorder, unspecified: Secondary | ICD-10-CM | POA: Diagnosis not present

## 2020-08-16 NOTE — Progress Notes (Signed)
Virtual Visit via Video Note  I connected with [name] on 08/16/20 at  9:00 AM EST by a video enabled telemedicine application and verified that I am speaking with the correct person using two identifiers.  At orientation to the IOP program, Case Manager discussed the limitations of evaluation and management by telemedicine and the availability of in person appointments. The patient expressed understanding and agreed to proceed with virtual visits throughout the duration of the program.   Location:  Patient: Patient Home Provider: Home Office   History of Present Illness: MDD  Observations/Objective: Check In: Case Manager checked in with all participants to review discharge dates, insurance authorizations, work-related documents and needs from the treatment team. Client stated needs and engaged in discussion.   Initial Therapeutic Activity: Counselor facilitated therapeutic processing with group members to assess mood and current functioning, prompting group members to share about application of skills, progress and challenges in treatment/personal lives. Client reports that he applied distraction method to address obsessive thoughts with success. Client presents with moderate depression and moderate anxiety. Client denied any current SI/HI/psychosis.  Second Therapeutic Activity: Counselor provided group with Anxiety Igniting Thoughts Assessment, from [employer] [facility], based off the book, the Anxious Brain. Counselor facilitated assessments, with group evaluating Pessimistic, Obsessive, Worrying, Perfectionism, Guilt/Shame, Right Brain Hemisphere Thinking. Group members shared their scores and reflections on the activity. Client noted that he scored low in most areas and has been working with professionals for decades to apply cognitive coping strategies to combat anxious thinking.  Check Out: Client prompted group members to share one productivity activity or one self-care practice they can  engage in today. Client plans to visit family for encouragement. Client endorsed safety plan to be followed to prevent safety issues.  Assessment and Plan: Clinician recommends that Client remain in IOP treatment to better manage mental health symptoms, stabilization and to address treatment plan goals. Clinician recommends adherence to crisis/safety plan, taking medications as prescribed, and following up with medical professionals if any issues arise.   Follow Up Instructions: Clinician will send Webex link for next session. The Client was advised to call back or seek an in-person evaluation if the symptoms worsen or if the condition fails to improve as anticipated.     I provided 180 minutes of non-face-to-face time during this encounter.     Hilbert Odor, LCSW

## 2020-08-17 ENCOUNTER — Other Ambulatory Visit (HOSPITAL_COMMUNITY): Payer: 59 | Admitting: Psychiatry

## 2020-08-17 ENCOUNTER — Other Ambulatory Visit: Payer: Self-pay

## 2020-08-17 ENCOUNTER — Encounter (HOSPITAL_COMMUNITY): Payer: Self-pay

## 2020-08-17 DIAGNOSIS — F429 Obsessive-compulsive disorder, unspecified: Secondary | ICD-10-CM | POA: Diagnosis not present

## 2020-08-17 DIAGNOSIS — F331 Major depressive disorder, recurrent, moderate: Secondary | ICD-10-CM

## 2020-08-17 NOTE — Progress Notes (Signed)
Virtual Visit via Video Note  I connected with Jared Cannon on 08/17/20 at  9:00 AM EST by a video enabled telemedicine application and verified that I am speaking with the correct person using two identifiers.  At orientation to the IOP program, Case Manager discussed the limitations of evaluation and management by telemedicine and the availability of in person appointments. The patient expressed understanding and agreed to proceed with virtual visits throughout the duration of the program.   Location:  Patient: Patient Home Provider: Home Office   History of Present Illness: OCD and MDD  Observations/Objective: Check In: Case Manager checked in with all participants to review discharge dates, insurance authorizations, work-related documents and needs from the treatment team. Client stated needs and engaged in discussion.   Initial Therapeutic Activity: Elena: Counselor introduced our guest speaker, Peggye Fothergill, [employer] Pharmacist, who shared about psychiatric medications, side effects, treatment considerations and how to communicate with medical professionals. Group Members asked questions and shared medication concerns.    Second Therapeutic Activity: Counselor prompted group members to reference a worksheet called, "Body Scan" to jot down questions and concerns about their physical health in preparation for their upcoming appointments with medical professionals. Client to follow up with specialist and primary providers, to determine coordinated care Counselor encouraged routine medical check-ups, preparing for appointments, following up with recommendations and seeking specialist if needed.  Check Out: Client prompted group members to share one productivity activity or one self-care practice they can engage in today. Client plans to make phone calls and visit sister who is in recovery. Client endorsed safety plan to be followed to prevent safety issues.  Assessment and Plan: Clinician  recommends that Client remain in IOP treatment to better manage mental health symptoms, stabilization and to address treatment plan goals. Clinician recommends adherence to crisis/safety plan, taking medications as prescribed, and following up with medical professionals if any issues arise.   Follow Up Instructions: Clinician will send Webex link for next session. The Client was advised to call back or seek an in-person evaluation if the symptoms worsen or if the condition fails to improve as anticipated.     I provided 180 minutes of non-face-to-face time during this encounter.     Hilbert Odor, LCSW [place], [place]

## 2020-08-18 ENCOUNTER — Other Ambulatory Visit: Payer: Self-pay

## 2020-08-18 ENCOUNTER — Encounter (HOSPITAL_COMMUNITY): Payer: Self-pay

## 2020-08-18 ENCOUNTER — Other Ambulatory Visit (HOSPITAL_COMMUNITY): Payer: 59 | Admitting: Psychiatry

## 2020-08-18 DIAGNOSIS — F429 Obsessive-compulsive disorder, unspecified: Secondary | ICD-10-CM | POA: Diagnosis not present

## 2020-08-18 DIAGNOSIS — F331 Major depressive disorder, recurrent, moderate: Secondary | ICD-10-CM

## 2020-08-18 NOTE — Progress Notes (Signed)
Virtual Visit via Video Note  I connected with [name] on 08/18/20 at  9:00 AM EST by a video enabled telemedicine application and verified that I am speaking with the correct person using two identifiers.  At orientation to the IOP program, Case Manager discussed the limitations of evaluation and management by telemedicine and the availability of in person appointments. The patient expressed understanding and agreed to proceed with virtual visits throughout the duration of the program.   Location:  Patient: Patient Home Provider: Home Office   History of Present Illness: MDD and OCD  Observations/Objective: Check In: Case Manager checked in with all participants to review discharge dates, insurance authorizations, work-related documents and needs from the treatment team. Client stated needs and engaged in discussion.   Initial Therapeutic Activity: Counselor introduced [place], [place] Chaplain to present information and discussion on Grief and Loss. Group members engaged in discussion, sharing how grief impacts them, what comforts them, what emotions are felt, labeling losses, etc. After guest speaker logged off, Counselor prompted group to spend 10-15 minutes journaling to process personal grief and loss situations. Counselor processed entries with group and client's identified areas for additional processing in individual therapy. Client noted related to the passing of his father.  Second Therapeutic Activity: Counselor provided group with an exhaustive list of community, state and national mental health, substance abuse and basic need resources. Counselor prompted group members to save numbers in their phones and to bookmark links for future use. Group members shared additional resources they were aware of and have utilized. Client noted they are interested in engaging in OCD foundation and Road to Recovery.  Check Out: Client prompted group members to share one productivity  activity or one self-care practice they can engage in today. Client plans to run errands, contact provider, and visit with family. Client endorsed safety plan to be followed to prevent safety issues.  Assessment and Plan: Clinician recommends that Client remain in IOP treatment to better manage mental health symptoms, stabilization and to address treatment plan goals. Clinician recommends adherence to crisis/safety plan, taking medications as prescribed, and following up with medical professionals if any issues arise.   Follow Up Instructions: Clinician will send Webex link for next session. The Client was advised to call back or seek an in-person evaluation if the symptoms worsen or if the condition fails to improve as anticipated.     I provided 180 minutes of non-face-to-face time during this encounter.     Hilbert Odor, LCSW

## 2020-08-19 ENCOUNTER — Other Ambulatory Visit: Payer: Self-pay

## 2020-08-19 ENCOUNTER — Encounter (HOSPITAL_COMMUNITY): Payer: Self-pay | Admitting: Psychiatry

## 2020-08-19 ENCOUNTER — Other Ambulatory Visit (HOSPITAL_COMMUNITY): Payer: 59 | Admitting: Psychiatry

## 2020-08-19 DIAGNOSIS — F429 Obsessive-compulsive disorder, unspecified: Secondary | ICD-10-CM | POA: Diagnosis not present

## 2020-08-19 DIAGNOSIS — F331 Major depressive disorder, recurrent, moderate: Secondary | ICD-10-CM

## 2020-08-19 NOTE — Progress Notes (Signed)
Virtual Visit via Video Note  I connected with Jared Cannon on 08/19/20 at  9:00 AM EST by a video enabled telemedicine application and verified that I am speaking with the correct person using two identifiers.  At orientation to the IOP program, Case Manager discussed the limitations of evaluation and management by telemedicine and the availability of in person appointments. The patient expressed understanding and agreed to proceed with virtual visits throughout the duration of the program.   Location:  Patient: Patient Home Provider: Home Office   History of Present Illness: MDD  Observations/Objective: Check In: Case Manager checked in with all participants to review discharge dates, insurance authorizations, work-related documents and needs from the treatment team. Client stated needs and engaged in discussion.   Initial Therapeutic Activity: Counselor facilitated therapeutic processing with group members to assess mood and current functioning, prompting group members to share about application of skills, progress and challenges in treatment/personal lives. Client reports that he completed family oriented tasks. Client resisted napping in afternoon, having better rest. Client presents with moderate depression and moderate anxiety. Client denied any current SI/HI/psychosis.  Second Therapeutic Activity: Counselor provided psychoeducation on understanding and coping with anxiety. Counselor shared a video by Rob Hickman, LMFT from [employer] on Rewiring the Anxious Brain. Counselor processed takeaways from the video, prompting group members to make action steps in conquering their anxiety through gradual exposure and acceptance. Counselor shared 5 breathing techniques and 8 grounding techniques for Clients to use in the process. Clients engaged in processing and activities.  Check Out: Client prompted group members to share one productivity activity or one self-care practice they can  engage in over the weekend. Client plans to contact providers to secure follow up. . Client endorsed safety plan to be followed to prevent safety issues.  Assessment and Plan: Clinician recommends that Client remain in IOP treatment to better manage mental health symptoms, stabilization and to address treatment plan goals. Clinician recommends adherence to crisis/safety plan, taking medications as prescribed, and following up with medical professionals if any issues arise.   Follow Up Instructions: Clinician will send Webex link for next session. The Client was advised to call back or seek an in-person evaluation if the symptoms worsen or if the condition fails to improve as anticipated.     I provided 180 minutes of non-face-to-face time during this encounter.     Hilbert Odor, LCSW
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
## 2020-08-20 NOTE — Psych (Signed)
Virtual Visit via Video Note  I connected with Jared Cannon on 08/04/20 at  9:00 AM EST by a video enabled telemedicine application and verified that I am speaking with the correct person using two identifiers.  Location: Jared: Jared Cannon Provider: clinical Cannon office    I discussed the limitations of evaluation and management by telemedicine and the availability of in person appointments. The Jared expressed understanding and agreed to proceed.  I discussed the assessment and treatment plan with the Jared. The Jared was provided an opportunity to ask questions and all were answered. The Jared agreed with the plan and demonstrated an understanding of the instructions.   The Jared was advised to call back or seek an in-person evaluation if the symptoms worsen or if the condition fails to improve as anticipated.  Pt was provided 240 minutes of non-face-to-face time during this encounter.   Jared Guiles, LCSW    [facility] PHP THERAPIST PROGRESS NOTE  Jared Cannon [id number]  Session Time: 9:00 - 10:00  Participation Level: Active  Behavioral Response: CasualAlertAnxious and Depressed  Type of Therapy: Group Therapy  Treatment Goals addressed: Coping  Interventions: CBT, DBT, Supportive and Reframing  Summary: Clinician led check-in regarding current stressors and situation.Clinician utilized active listening and empathetic response and validated Jared emotions. Clinician facilitated processing group on pertinent issues.   Therapist Response: [name] is a 50 y.o. male who presents with OCD and depression symptoms.  Jared arrived within time allowed and reports that he is feeling "anxious." Jared rates hismood at a6.5on a scale of 1-10 with 10 being great. Pt reports he didn't want to get out of bed this morning and he pushed himself but it spiked his anxiety. Pt states he took another nap in the afternoon and slept 9 hours last night. Pt reports thinking  about group topics yesterday and feeling that acceptance is important for him.  Pt continues to struggle with time due to rituals. Pt able to process. Pt engaged in discussion.      Session Time: 10:00 - 11:00   Participation Level:Active  Behavioral Response:CasualAlertDepressed  Type of Therapy:GroupTherapy  Treatment Goals addressed: Coping  Interventions:CBT, DBT, Supportive and Reframing  Summary:Cln continued topic of DBT radical acceptance. Cln utilized DBT Distress Tolerance handout 11 to discuss the process of applying radical acceptance into our lives.   Therapist Response: Pt engaged in discussion and struggles to determine how to fit strategies into his life. Pt exhibits obsessive thoughts regarding a trigger regarding his brother and becomes quickly tangential when seeking to relate topic to the issue.       Session Time: 11:00- 12:00  Participation Level:Active  Behavioral Response:CasualAlertDepressed  Type of Therapy: Group Therapy, OT  Treatment Goals addressed: Coping  Interventions:Psychosocial skills training, Supportive  Summary:Occupational Therapy group  Therapist Response:Jared engaged in group. See OT note.      Session Time: 12:00 -1:00  Participation Level:Active  Behavioral Response:CasualAlertDepressed  Type of Therapy:Group therapy  Treatment Goals addressed: Coping  Interventions:CBT; Solution focused; Supportive; Reframing  Summary:12:00 - 12:50: Cln introduced topic of vulnerability. Group viewed TED talk "the power of vulnerability" to aid discussion. Group members shared the relationship they have with vulnerability and how it impacts their relationships.  12:50 -1:00 Clinician led check-out. Clinician assessed for immediate needs, medication compliance and efficacy, and safety concerns  Therapist Response:12:00 - 12:50: Pt engaged in discussion and reports vulnerability is a  struggle for him. Pt continues to have increased tangential and long-winded speech.  12:50 - 1:00: At check-out, patientrates his mood at a6 on a scale of 1-10 with 10 being great.Pt reports afternoon plans ofshowering and getting a hair cute. Pt demonstrates some progress as evidenced by completing two tasks he has been trying to complete for over a week.. Jared denies SI/HI at the end of group.     Suicidal/Homicidal: Nowithout intent/plan  Plan: Pt will continue in PHP while working to decrease depression symptoms; decrease negative impact of OCD symptoms, and increase ability to manage symptoms in a healthy manner.   Diagnosis: Obsessive-compulsive disorder, unspecified type [F42.9]    1. Obsessive-compulsive disorder, unspecified type   2. MDD (major depressive disorder), recurrent episode, moderate (HCC)       Jared Guiles, LCSW 08/20/2020
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
## 2020-08-22 ENCOUNTER — Other Ambulatory Visit: Payer: Self-pay

## 2020-08-22 ENCOUNTER — Other Ambulatory Visit (HOSPITAL_COMMUNITY): Payer: 59 | Admitting: Psychiatry

## 2020-08-22 ENCOUNTER — Encounter (HOSPITAL_COMMUNITY): Payer: Self-pay

## 2020-08-22 DIAGNOSIS — F331 Major depressive disorder, recurrent, moderate: Secondary | ICD-10-CM

## 2020-08-22 DIAGNOSIS — F429 Obsessive-compulsive disorder, unspecified: Secondary | ICD-10-CM | POA: Diagnosis not present

## 2020-08-22 NOTE — Progress Notes (Signed)
Virtual Visit via Video Note  I connected with Jared Cannon on 08/22/20 at  9:00 AM EST by a video enabled telemedicine application and verified that I am speaking with the correct person using two identifiers. At orientation to the IOP program, Case Manager discussed the limitations of evaluation and management by telemedicine and the availability of in person appointments. The patient expressed understanding and agreed to proceed with virtual visits throughout the duration of the program.   Location:  Patient: Patient Home Provider: Home Office   History of Present Illness: MDD and OCD  Observations/Objective: Check In: Case Manager checked in with all participants to review discharge dates, insurance authorizations, work-related documents and needs from the treatment team. Client stated needs and engaged in discussion.   Initial Therapeutic Activity: Counselor facilitated therapeutic processing with group members to assess mood and current functioning, prompting group members to share about application of skills, progress and challenges in treatment/personal lives. Client reports that he experienced sleep challenges over the weekend, sleeping during the day and restless at night. We discussed starting a medication and sleep journal, as well as being assessed for sleep apnea.. Client presents with mild depression and moderate anxiety. Client denied any current SI/HI/psychosis.  Second Therapeutic Activity: Counselor engaged group in creating an ECOMAP, assessing their support system, systems they interface in and concepts of self. Counselor facilitated the steps of adding relationship lines, energy lines and types of boundaries. Counselor prompted one group member to share, noting that due to time, other group members will share tomorrow. Counselor challenged each to assess what is missing, what needs to be removed or improved.  Check Out: Client prompted group members to share one productivity  activity or one self-care practice they can engage in today. Client plans to "get outside" due to nice weather. Client endorsed safety plan to be followed to prevent safety issues.  Assessment and Plan: Clinician recommends that Client remain in IOP treatment to better manage mental health symptoms, stabilization and to address treatment plan goals. Clinician recommends adherence to crisis/safety plan, taking medications as prescribed, and following up with medical professionals if any issues arise.   Follow Up Instructions: Clinician will send Webex link for next session. The Client was advised to call back or seek an in-person evaluation if the symptoms worsen or if the condition fails to improve as anticipated.     I provided 180 minutes of non-face-to-face time during this encounter.     Hilbert Odor, LCSW

## 2020-08-23 ENCOUNTER — Other Ambulatory Visit (HOSPITAL_COMMUNITY): Payer: 59 | Attending: Psychiatry | Admitting: Psychiatry

## 2020-08-23 ENCOUNTER — Other Ambulatory Visit: Payer: Self-pay

## 2020-08-23 DIAGNOSIS — F419 Anxiety disorder, unspecified: Secondary | ICD-10-CM | POA: Insufficient documentation

## 2020-08-23 DIAGNOSIS — F32A Depression, unspecified: Secondary | ICD-10-CM | POA: Diagnosis not present

## 2020-08-23 DIAGNOSIS — R45851 Suicidal ideations: Secondary | ICD-10-CM | POA: Insufficient documentation

## 2020-08-23 DIAGNOSIS — F429 Obsessive-compulsive disorder, unspecified: Secondary | ICD-10-CM | POA: Diagnosis present

## 2020-08-23 DIAGNOSIS — Z62820 Parent-biological child conflict: Secondary | ICD-10-CM | POA: Insufficient documentation

## 2020-08-23 DIAGNOSIS — Z638 Other specified problems related to primary support group: Secondary | ICD-10-CM | POA: Diagnosis not present

## 2020-08-23 DIAGNOSIS — F331 Major depressive disorder, recurrent, moderate: Secondary | ICD-10-CM

## 2020-08-24 ENCOUNTER — Encounter (HOSPITAL_COMMUNITY): Payer: Self-pay

## 2020-08-24 ENCOUNTER — Telehealth (HOSPITAL_COMMUNITY): Payer: Self-pay | Admitting: Psychiatry

## 2020-08-24 NOTE — Progress Notes (Signed)
Virtual Visit via Video Note  I connected with Jared Cannon on 08/24/20 at  9:00 AM EST by a video enabled telemedicine application and verified that I am speaking with the correct person using two identifiers.  At orientation to the IOP program, Case Manager discussed the limitations of evaluation and management by telemedicine and the availability of in person appointments. The patient expressed understanding and agreed to proceed with virtual visits throughout the duration of the program.   Location:  Patient: Patient Home Provider: Home Office   History of Present Illness: MDD  Observations/Objective: Check In: Case Manager checked in with all participants to review discharge dates, insurance authorizations, work-related documents and needs from the treatment team. Client stated needs and engaged in discussion. Case Manager introduced new Client to group, with group members making introductions to start the joining process.   Initial Therapeutic Activity: Counselor gave instructions for the ECO-MAPP activity for those who were not in group yesterday. Returning group member continued to present their takeaways from the activity. Many noted that they are experiencing high levels of stress in the majority of their relationships. Counselor prompted all group members to reflect on what they can practically do to alleviate some stress in their life, through conversations, expressing needs, setting boundaries and reaching out for more support if needed.   Second Therapeutic Activity: Counselor closed program by allowing time to celebrate a graduating group member. Counselor shared reflections on progress and allow space for group members to share well wishes and appreciates to the graduating client. Counselor prompted graduating client to share takeaways, reflect on progress and final thoughts for the group.  Check Out: Client prompted group members to share one productivity activity or one self-care  practice they can engage in today. Client plans to looking into new list of recommended providers. Client endorsed safety plan to be followed to prevent safety issues. Client denied any current SI/HI/psychosis.  Assessment and Plan: Clinician recommends that Client remain in IOP treatment to better manage mental health symptoms, stabilization and to address treatment plan goals. Clinician recommends adherence to crisis/safety plan, taking medications as prescribed, and following up with medical professionals if any issues arise.   Follow Up Instructions: Clinician will send Webex link for next session. The Client was advised to call back or seek an in-person evaluation if the symptoms worsen or if the condition fails to improve as anticipated.     I provided 180 minutes of non-face-to-face time during this encounter.     Hilbert Odor, LCSW

## 2020-08-25 ENCOUNTER — Other Ambulatory Visit: Payer: Self-pay

## 2020-08-25 ENCOUNTER — Other Ambulatory Visit (HOSPITAL_COMMUNITY): Payer: 59 | Admitting: Psychiatry

## 2020-08-25 DIAGNOSIS — F429 Obsessive-compulsive disorder, unspecified: Secondary | ICD-10-CM

## 2020-08-25 DIAGNOSIS — F419 Anxiety disorder, unspecified: Secondary | ICD-10-CM | POA: Diagnosis not present

## 2020-08-25 DIAGNOSIS — F331 Major depressive disorder, recurrent, moderate: Secondary | ICD-10-CM

## 2020-08-26 ENCOUNTER — Other Ambulatory Visit (HOSPITAL_COMMUNITY): Payer: 59 | Admitting: Psychiatry

## 2020-08-26 ENCOUNTER — Other Ambulatory Visit: Payer: Self-pay

## 2020-08-26 ENCOUNTER — Encounter (HOSPITAL_COMMUNITY): Payer: Self-pay | Admitting: Psychiatry

## 2020-08-26 ENCOUNTER — Encounter (HOSPITAL_COMMUNITY): Payer: Self-pay

## 2020-08-26 DIAGNOSIS — F429 Obsessive-compulsive disorder, unspecified: Secondary | ICD-10-CM

## 2020-08-26 DIAGNOSIS — F419 Anxiety disorder, unspecified: Secondary | ICD-10-CM | POA: Diagnosis not present

## 2020-08-26 DIAGNOSIS — F331 Major depressive disorder, recurrent, moderate: Secondary | ICD-10-CM

## 2020-08-26 NOTE — Progress Notes (Signed)
Virtual Visit via Video Note  I connected with [name] on 08/26/20 at  9:00 AM EST by a video enabled telemedicine application and verified that I am speaking with the correct person using two identifiers.  At orientation to the IOP program, Case Manager discussed the limitations of evaluation and management by telemedicine and the availability of in person appointments. The patient expressed understanding and agreed to proceed with virtual visits throughout the duration of the program.   Location:  Patient: Patient Home Provider: Home Office   History of Present Illness: MDD and OCD  Observations/Objective: Check In: Case Manager checked in with all participants to review discharge dates, insurance authorizations, work-related documents and needs from the treatment team. Client stated needs and engaged in discussion.   Initial Therapeutic Activity: Counselor facilitated therapeutic processing with group members to assess mood and current functioning, prompting group members to share about application of skills, progress and challenges in treatment/personal lives. Client reports that he confirmed follow up appointments with his provider and confirmed waitlist time for residential treatment program providing OCD treatment. Client presents with moderate depression and moderte anxiety. Client denied any current SI/HI/psychosis.  Second Therapeutic Activity: Counselor introduced [place], [place] [employer] to present information and discussion on Grief and Loss. Group members engaged in discussion, sharing how grief impacts them, what comforts them, what emotions are felt, labeling losses, etc. After guest speaker logged off, Counselor prompted group to spend 10-15 minutes journaling to process personal grief and loss situations. Counselor processed entries with group and client's identified areas for additional processing in individual therapy. Client noted loss related to job, independence  and former interests.  Check Out: Client prompted group members to share one productivity activity or one self-care practice they can engage in over the weekend. Client plans to go outside and spend time with family. Client endorsed safety plan to be followed to prevent safety issues.   Assessment and Plan: Clinician recommends that Client remain in IOP treatment to better manage mental health symptoms, stabilization and to address treatment plan goals. Clinician recommends adherence to crisis/safety plan, taking medications as prescribed, and following up with medical professionals if any issues arise.   Follow Up Instructions: Clinician will send Webex link for next session. The Client was advised to call back or seek an in-person evaluation if the symptoms worsen or if the condition fails to improve as anticipated.     I provided 180 minutes of non-face-to-face time during this encounter.     Hilbert Odor, LCSW

## 2020-08-26 NOTE — Progress Notes (Signed)
Virtual Visit via Video Note  I connected with Jared Cannon on 08/26/20 at  9:00 AM EST by a video enabled telemedicine application and verified that I am speaking with the correct person using two identifiers.  At orientation to the IOP program, Case Manager discussed the limitations of evaluation and management by telemedicine and the availability of in person appointments. The patient expressed understanding and agreed to proceed with virtual visits throughout the duration of the program.   Location:  Patient: Patient Home Provider: Home Office   History of Present Illness: MDD and OCD  Observations/Objective: Check In: Case Manager checked in with all participants to review discharge dates, insurance authorizations, work-related documents and needs from the treatment team. Client stated needs and engaged in discussion.   Initial Therapeutic Activity: Counselor facilitated therapeutic processing with group members to assess mood and current functioning, prompting group members to share about application of skills, progress and challenges in treatment/personal lives. Client reports that he coped with his high anxiety yesterday by rest, sleeping, and repairing relationship with his mother. Client states he is less anxious and is focusing energy to change living situation. Client presents with moderate depression and moderate anxiety. Client denied any current SI/HI/psychosis.  Second Therapeutic Activity: Counselor introduced [employer], representative with [facility] [place] to share about programming. Group Members asked questions and engaged in discussion, as Trinna Post shared about Peer Support, Support Groups and the [employer]. Client stated that they are interested in connecting with the recovery support groups and the wellness courses and peer support.  Check Out: Client prompted group members to share one productivity activity or one self-care practice they can engage in today.  Client plans to confirm dr appoint with provider and pick up medicine from pharmacy. Client endorsed safety plan to be followed to prevent safety issues.   Assessment and Plan: Clinician recommends that Client remain in IOP treatment to better manage mental health symptoms, stabilization and to address treatment plan goals. Clinician recommends adherence to crisis/safety plan, taking medications as prescribed, and following up with medical professionals if any issues arise.   Follow Up Instructions: Clinician will send Webex link for next session. The Client was advised to call back or seek an in-person evaluation if the symptoms worsen or if the condition fails to improve as anticipated.     I provided 180 minutes of non-face-to-face time during this encounter.     Hilbert Odor, LCSW

## 2020-08-29 ENCOUNTER — Other Ambulatory Visit: Payer: Self-pay

## 2020-08-29 ENCOUNTER — Other Ambulatory Visit (HOSPITAL_BASED_OUTPATIENT_CLINIC_OR_DEPARTMENT_OTHER): Payer: 59 | Admitting: Psychiatry

## 2020-08-29 ENCOUNTER — Encounter (HOSPITAL_COMMUNITY): Payer: Self-pay | Admitting: Family

## 2020-08-29 DIAGNOSIS — F419 Anxiety disorder, unspecified: Secondary | ICD-10-CM | POA: Diagnosis not present

## 2020-08-29 DIAGNOSIS — F331 Major depressive disorder, recurrent, moderate: Secondary | ICD-10-CM

## 2020-08-29 DIAGNOSIS — F429 Obsessive-compulsive disorder, unspecified: Secondary | ICD-10-CM

## 2020-08-29 NOTE — Patient Instructions (Signed)
D:  Pt completed MH-IOP today.  A:  Discharge today.  F/U with Gretchen Short, LCSW on 09-02-20 and Dr. Donell Beers on 09-08-20.  Strongly encouraged support groups.  R:  Pt receptive.

## 2020-08-29 NOTE — Progress Notes (Signed)
Virtual Visit via Video Note  I connected with Jared Cannon on _0 @ at  9:00 AM EST by a video enabled telemedicine application and verified that I am speaking with the correct person using two identifiers.  Location: Patient: at home Provider: at office   I discussed the limitations of evaluation and management by telemedicine and the availability of in person appointments. The patient expressed understanding and agreed to proceed.  I discussed the assessment and treatment plan with the patient. The patient was provided an opportunity to ask questions and all were answered. The patient agreed with the plan and demonstrated an understanding of the instructions.   The patient was advised to call back or seek an in-person evaluation if the symptoms worsen or if the condition fails to improve as anticipated.  I provided 20 minutes of non-face-to-face time during this encounter.   Dellia Nims, M.Ed,CNA   Patient ID: Jared Cannon, male   DOB: 1970/09/29, 50 y.o.   MRN: [id number] As per previous CCA states:Pt presents as referral to [facility] per his psychiatrist, Dr Casimiro Needle. Pt reports history of OCD and depression diagnoses. Pt states he has been diagnosed with OCD since age 70 and that it has been getting worse over the past year, and even more over the past 3 months. Pt states he has been unable to work for the last 3 months due to increase in his ritualistic behaviors and obsessions. Pt reports decline in ADL's and goal oriented tasks. Pt reports experiencing SI over the past few weeks and reports current intermittent passive SI and hopelessness. Pt denies AVH and substance use. Pt states he is exploring residential treatment at a facility that specializes in [place] however there is a 2 month waiting list. Current Symptoms/Problems:Pt reports ritualistic behaviors, assurance seeking, obsessive thoughts, depressed mood, hoplessness, and anxiety.  Pt transitioned from PHP to [place] today.  Reports that  PHP went well.  "I am looking for a new therapist that has more experience with OCD.  On a scale of 1-10 (10 being the worst); pt rates his depression a 3 and anxiety a 3.  Denies SI/HI or A/V hallucinations.    Pt completed MH-IOP today.  Pt attended 14 virtual MH-IOP days.  States he has learned a lot of coping skills.  Reports although this was a good experience for him; he c/o his OCD getting worse.  States he met with Windle Guard, LCSW yesterday and that went very well.  Pt is still interested in going to [employer] for his OCD.  Denies SI/HI or A/V hallucinations.  On a scale of 1-10 (10 being the worst); pt rates his depression at a 4 and anxiety at a 6.  A:  D/C today.  F/U with Windle Guard, LCSW on 09-02-20 and Dr. Casimiro Needle on 09-08-20.  Pt will return to work whenever Dr. Casimiro Needle returns him.  R:  Pt receptive.  Dellia Nims, M.Ed,CNA

## 2020-08-29 NOTE — Progress Notes (Signed)
  [facility] Intensive Outpatient Program Discharge Summary  Jared Cannon [id number]  Admission date: 08/09/2020 Discharge date: 08/29/2020  Reason for admission: Per admission assessment note: Jared Cannon a 50 y.o. Caucasianmale presents with worsening anxiety related to his OCD symptoms.Reports he is currently followed by psychiatry and therapy. States he was taking Lexapro for a while however medication has been ineffective which she was recently started on Paxil.Patient reports his depression and anxiety has become the dominant stressor and has interfered work life balance. Does admit to passive suicidal ideations. Denies plan or intent. "I have obsessedover how I would do it,but currently do not have a plan."Reported inpatient admission in early 20s. States he has been diagnosed with obsessive-compulsive disorder since the age of 72. Reported rituals and obsessions are sometimes "paralyzing" Nazar reported " I get flustered,stressed and overwhelmed easily."States"it is a vicious cycle,"as patient reports ongoing ruminations and mild paranoia.  Chemical Use History: Denied   Family of Origin Issues: stated working progress with the relationship between he and his mother. Stated the decline/ strain between the two of them worsen after he was forced to move back in with his mother.   Progress in Program Toward Treatment Goals:  Ongoing, patient attended, participated and completed Partial Hospitalization and Instensive Outpatient programming. Reported ongoing rumanation with OCD and depression. Patient is denying suicidal or homicidal ideaitons. Patient declined medication refills at this time.   Progress (rationale):  Keep follow with MD Plovsky 09/08/2020 and [facility].  Patient reported he reestablished care with Gretchen Short his therapist.    Take all medications as prescribed. Keep all follow-up appointments as scheduled.  Do not  consume alcohol or use illegal drugs while on prescription medications. Report any adverse effects from your medications to your primary care provider promptly.  In the event of recurrent symptoms or worsening symptoms, call 911, a crisis hotline, or go to the nearest emergency department for evaluation.     Chilton Greathouse NP 08/29/2020

## 2020-08-30 ENCOUNTER — Other Ambulatory Visit: Payer: Self-pay

## 2020-08-30 ENCOUNTER — Ambulatory Visit (HOSPITAL_COMMUNITY): Payer: 59 | Admitting: Psychiatry

## 2020-08-30 ENCOUNTER — Encounter (HOSPITAL_COMMUNITY): Payer: Self-pay | Admitting: Psychiatry

## 2020-08-30 ENCOUNTER — Other Ambulatory Visit (HOSPITAL_COMMUNITY): Payer: 59

## 2020-08-30 DIAGNOSIS — F331 Major depressive disorder, recurrent, moderate: Secondary | ICD-10-CM

## 2020-08-30 NOTE — Progress Notes (Signed)
Client communicated with Counselor that he was unable to stay for the session today. Client intends to join next week.   Hilbert Odor, LCSW

## 2020-08-31 ENCOUNTER — Ambulatory Visit (HOSPITAL_COMMUNITY): Payer: 59

## 2020-08-31 ENCOUNTER — Encounter (HOSPITAL_COMMUNITY): Payer: Self-pay | Admitting: Psychiatry

## 2020-08-31 NOTE — Progress Notes (Signed)
Virtual Visit via Video Note  I connected with Jared Cannon on 08/31/20 at  9:00 AM EST by a video enabled telemedicine application and verified that I am speaking with the correct person using two identifiers.  At orientation to the IOP program, Case Manager discussed the limitations of evaluation and management by telemedicine and the availability of in person appointments. The patient expressed understanding and agreed to proceed with virtual visits throughout the duration of the program.   Location:  Patient: Patient Home Provider: Home Office   History of Present Illness: MDD and OCD  Observations/Objective: Check In: Case Manager checked in with all participants to review discharge dates, insurance authorizations, work-related documents and needs from the treatment team. Client stated needs and engaged in discussion.   Initial Therapeutic Activity: Counselor facilitated therapeutic processing with group members to assess mood and current functioning, prompting group members to share about application of skills, progress and challenges in treatment/personal lives. Client reports that he had a productive weekend, looking into resources, repairing relationship with mother and meeting with his former therapist. Client reports feeling more optimistic about transitioning down from the IOP program. Client presents with mild depression and mild anxiety. Client denied any current SI/HI/psychosis.  Second Therapeutic Activity: Counselor engaged group in discussion about having difficult conversations, in order to address depression and anxiety. Counselor provided group with a Ted Talk presentation on 3 Strategies in [employer]. Group processed takeaways afterwards. Counselor shared additional psychoeducation on starting conversations on difficult topics. Group members shared how to apply skills from session.  Check Out: Client prompted group members to share one productivity activity or  one self-care practice they can engage in today. Client plans to follow up with providers, attend Aftercare Group tomorrow and look into alternative housing. Client shared about progress in treatment and acknowledged support from group members. Client endorsed safety plan to be followed to prevent safety issues.   Assessment and Plan: Clinician recommends that Client remain in IOP treatment to better manage mental health symptoms, stabilization and to address treatment plan goals. Clinician recommends adherence to crisis/safety plan, taking medications as prescribed, and following up with medical professionals if any issues arise.   Follow Up Instructions: Clinician will send Webex link for next session. The Client was advised to call back or seek an in-person evaluation if the symptoms worsen or if the condition fails to improve as anticipated.     I provided 180 minutes of non-face-to-face time during this encounter.     Hilbert Odor, LCSW

## 2020-09-01 ENCOUNTER — Ambulatory Visit (HOSPITAL_COMMUNITY): Payer: 59

## 2020-09-02 ENCOUNTER — Ambulatory Visit (HOSPITAL_COMMUNITY): Payer: 59

## 2020-09-06 ENCOUNTER — Ambulatory Visit (INDEPENDENT_AMBULATORY_CARE_PROVIDER_SITE_OTHER): Payer: 59 | Admitting: Psychiatry

## 2020-09-06 ENCOUNTER — Other Ambulatory Visit: Payer: Self-pay

## 2020-09-06 DIAGNOSIS — F429 Obsessive-compulsive disorder, unspecified: Secondary | ICD-10-CM

## 2020-09-06 DIAGNOSIS — F331 Major depressive disorder, recurrent, moderate: Secondary | ICD-10-CM

## 2020-09-07 ENCOUNTER — Encounter (HOSPITAL_COMMUNITY): Payer: Self-pay | Admitting: Psychiatry

## 2020-09-07 NOTE — Progress Notes (Signed)
Virtual Visit via Video Note  I connected with Jared Cannon on 09/06/20 at 12:00 PM EDT by a video enabled telemedicine application and verified that I am speaking with the correct person using two identifiers.  I discussed the limitations of evaluation and management by telemedicine and the availability of in person appointments. The patient expressed understanding and agreed to proceed.    Location: Patient: Patient Home Provider: Home Office   GROUP GOAL: Client will attend IOP Aftercare Group Therapy 2-4x a month to connect with peers and to apply strategies discussed to improve mental health condition.  History of Present Illness: MDD and OCD  Observations/Objective: Counselor met with Patient in the context of Group Therapy, via Webex. Counselor prompted Patient to share updates on coping skill application and individual therapeutic process in management of mental health. Counselor prompted Patient to share areas of concern, challenges and identify barriers to meeting current goals. Topics covered: communicating effectively with psychiatrists, medication compliance, communication with support system, low energy, subconscious trauma/grief triggers, and specialized treatment for OCD. Patient engaged in discussion, provided feedback for others within the group and took note of additional strategies reviewed and discussed.   Assessment and Plan: Counselor recommends client continue following treatment plan goals, following crisis plan and following up with behavioral health and medical providers as needed. Patient is welcome to join group at next session.   Follow Up Instructions: Counselor to provide link for next session via Webex platform. The patient was advised to call back or seek an in-person evaluation if the symptoms worsen or if the condition fails to improve as anticipated.  I provided 70 minutes of non-face-to-face time during this encounter.   Lise Auer, LCSW

## 2021-02-06 ENCOUNTER — Other Ambulatory Visit: Payer: Self-pay

## 2021-02-06 ENCOUNTER — Ambulatory Visit (HOSPITAL_COMMUNITY)
Admission: EM | Admit: 2021-02-06 | Discharge: 2021-02-06 | Disposition: A | Discharge status: Home / Self Care | Payer: 59

## 2021-02-06 DIAGNOSIS — F422 Mixed obsessional thoughts and acts: Secondary | ICD-10-CM

## 2021-02-06 NOTE — ED Provider Notes (Signed)
Behavioral Health Urgent Care Medical Screening Exam  Patient Name: Jared Cannon MRN: [id number] Date of Evaluation: 02/06/21 Chief Complaint:   "I had HI last night towards my mom and I'm upset about this" Diagnosis:  Final diagnoses:  Mixed obsessional thoughts and acts    History of Present illness: Jared Cannon is a 50 y.o. male w/ a hx of OCD and MDD. Patient has been psychiatrically hospitalized in [place] and recently left the [facility] facility in [place] which specializes in OCD treatment. Patient reports he was at the facility for 2 mon and has been to the facility once in the past.   Patient reports that his medications were adjusted during his stay at [facility]. Patient reports that his Clomipramine was decreased to 75mg  because his blood levels of clomipramine were elevated. Patient Paxil was discontinued and transitioned to Prozac and patient was prescribed 0.5mg  of Xanax TID as a part of his Exposure and Response Prevention Therapy (ERP). Patient reports that since returning he has not been taking his Xanax TID and has been trying to take it less, but he notices that he feels "more on edge and less patient." Patient reports that yesterday evening he became upset about not being able to put pizza boxes in a fridge and this led to him screaming and having and episode that left him hoarse. Patient reports that he ended the episode by taking 1mg  of Xanax (he did not take the 0.5mg  noon dose earlier) and went to lay down. Patient reports he had a series of intrusive thoughts with his most concerning being HI towards his mother. Patient reports he continues to ruminate on this thought and is very disturbed that he would think this way because" I love my mother and I would never want to hurt her." Patient is having a difficult time moving past this thought and is concerned that he may have other negative thoughts. Patient denies SI, HI, and AVH at the time of assessment. Patient endorses  that he has a therapy appt this Thurs and he has reached out to his OP psychiatrist to have his appt next week moved up, but has not heard back.   Patient endorsed that he was living in an unhealthy situation and felt that if he could live in a separate place from his mother (whose home has dog and urine feces in the carpet and very little walking room due to clutter) patient felt he would be able to better moderate his irritability. Patient noted that unfortunately due to his OCD he has had a difficult time returning to work this year.   Psychiatric Specialty Exam  Presentation  General Appearance:Appropriate for Environment  Eye Contact:Good  Speech:Clear and Coherent  Speech Volume:Normal  Handedness:No data recorded  Mood and Affect  Mood:Anxious  Affect:Appropriate   Thought Process  Thought Processes:Coherent  Descriptions of Associations:Intact  Orientation:Full (Time, Place and Person)  Thought Content:Logical; Perseveration  Diagnosis of Schizophrenia or Schizoaffective disorder in past: No   Hallucinations:None  Ideas of Reference:None  Suicidal Thoughts:No  Homicidal Thoughts:No   Sensorium  Memory:Immediate Good; Recent Good; Remote Good  Judgment:Good  Insight:Good   Executive Functions  Concentration:Fair  Attention Span:Fair  Recall:Good  Fund of Knowledge:Good  Language:Good   Psychomotor Activity  Psychomotor Activity:Normal   Assets  Assets:Communication Skills; Desire for Improvement; Housing; Resilience; Social Support   Sleep  Sleep:Fair  Number of hours:  No data recorded  No data recorded  Physical Exam: Physical Exam Constitutional:  Appearance: Normal appearance.  HENT:     Head: Normocephalic and atraumatic.     Nose: Nose normal.  Eyes:     Extraocular Movements: Extraocular movements intact.     Pupils: Pupils are equal, round, and reactive to light.  Cardiovascular:     Rate and Rhythm: Normal  rate.     Pulses: Normal pulses.  Pulmonary:     Effort: Pulmonary effort is normal.  Musculoskeletal:        General: Normal range of motion.  Skin:    General: Skin is warm and dry.  Neurological:     General: No focal deficit present.     Mental Status: He is alert.   Review of Systems  Constitutional:  Negative for chills and fever.  HENT:  Negative for hearing loss.   Eyes:  Negative for blurred vision.  Respiratory:  Negative for cough and wheezing.   Cardiovascular:  Negative for chest pain.  Gastrointestinal:  Negative for abdominal pain.  Neurological:  Negative for dizziness.  Psychiatric/Behavioral:  Negative for suicidal ideas.   There were no vitals taken for this visit. There is no height or weight on file to calculate BMI.  Musculoskeletal: Strength & Muscle Tone: within normal limits Gait & Station: normal Patient leans: N/A   [facility] MSE Discharge Disposition for Follow up and Recommendations: Based on my evaluation the patient does not appear to have an emergency medical condition and can be discharged with resources and follow up care in outpatient services for Medication Management OCD Patient appears to be actively obsessing over an intrusive thought and is having difficulty with the thought and is very distressed by it and endorses that he is trying (unsuccessfully) to push the thought of his mind.  - Patient was recommended by provider to take his Xanax as prescribed to help with the increase irritability that patient described led to the breakdown and also ease some of the intrusive thoughts. - Patient recommend to f/u with his outpatient care team - SW also consulted to help patient with housing options.   PGY-2 Bobbye Morton, MD 02/06/2021, 5:15 PM

## 2021-02-06 NOTE — Progress Notes (Signed)
Pt given resources for housing and information on group home placement.

## 2021-02-06 NOTE — BH Assessment (Addendum)
Comprehensive Clinical Assessment (CCA) Note  02/06/2021 Jared Cannon [id number]  DISPOSITIONMorrie Sheldon MD recommends patient be discharged and follow up with current provider.    Flowsheet Row ED from 02/06/2021 in [facility]  C-SSRS RISK CATEGORY No Risk      The patient demonstrates the following risk factors for suicide: Chronic risk factors for suicide include: N/A. Acute risk factors for suicide include: N/A. Protective factors for this patient include: coping skills. Considering these factors, the overall suicide risk at this point appears to be low. Patient is not appropriate for outpatient follow up.   Jared Cannon is a 50 year old male who presents voluntary and unaccompanied to [facility]. Patient reports ongoing intrusive thoughts for the last 24 hours associated with H/I towards his mother. Patient states he would never act on those thoughts. Patient states he resides with his mother and had a verbal altercation yesterday which brought on "bad thoughts" in reference to harming his mother. Patient denies any plan or intent and denies any previous history of violence. Patient denies any S/I. Patient denies any AVH. Patient reports a ongoing history significant for OCD and receives OP services from [facility] MD who assists with medication management. Patient states he has a upcoming appointment on August 23 for medication management. Patient also sees [facility] LCSW for weekly therapy (ERP therapy). Patient reports current medication compliance. Patient denies access to firearms or SA issues. Patient reports he recently returned from a 3 month residential facility in [place] for his OCD.  Patient also reported, previous inpatient admissions to include a facility in [place], [place].     Pt presents alert with normal speech. Pt's mood, affect is anxious. Pt's memory was intact with thoughts organized. Pt did not appear to be responding to internal stimuli. Patient is  currently contracting for safety.    Chief Complaint: No chief complaint on file.  Visit Diagnosis: OCD    CCA Screening, Triage and Referral (STR)  Patient Reported Information How did you hear about [place]? Self  What Is the Reason for Your Visit/Call Today? Intrusive thoughts  How Long Has This Been Causing You Problems? <Week  What Do You Feel Would Help You the Most Today? Stress Management   Have You Recently Had Any Thoughts About Hurting Yourself? No  Are You Planning to Commit Suicide/Harm Yourself At This time? No   Have you Recently Had Thoughts About Hurting Someone Jared Cannon? No  Are You Planning to Harm Someone at This Time? No  Explanation: No data recorded  Have You Used Any Alcohol or Drugs in the Past 24 Hours? No  How Long Ago Did You Use Drugs or Alcohol? No data recorded What Did You Use and How Much? No data recorded  Do You Currently Have a Therapist/Psychiatrist? Yes  Name of Therapist/Psychiatrist: Daiva Cannon   Have You Been Recently Discharged From Any Office Practice or Programs? No  Explanation of Discharge From Practice/Program: No data recorded    CCA Screening Triage Referral Assessment Type of Contact: Face-to-Face  Telemedicine Service Delivery:   Is this Initial or Reassessment? No data recorded Date Telepsych consult ordered in [facility]:  No data recorded Time Telepsych consult ordered in [facility]:  No data recorded Location of Assessment: [facility] Assessment Services  Provider Location: [facility] Assessment Services   Collateral Involvement: None at this time   Does Patient Have a Automotive engineer Guardian? No data recorded Name and Contact of Legal Guardian: No data recorded If  Minor and Not Living with Parent(s), Who has Custody? NA  Is CPS involved or ever been involved? Never  Is APS involved or ever been involved? Never   Patient Determined To Be At Risk for Harm To Self or Others Based on Review of Patient Reported  Information or Presenting Complaint? No  Method: No data recorded Availability of Means: No data recorded Intent: No data recorded Notification Required: No data recorded Additional Information for Danger to Others Potential: No data recorded Additional Comments for Danger to Others Potential: No data recorded Are There Guns or Other Weapons in Your Home? No data recorded Types of Guns/Weapons: No data recorded Are These Weapons Safely Secured?                            No data recorded Who Could Verify You Are Able To Have These Secured: No data recorded Do You Have any Outstanding Charges, Pending Court Dates, Parole/Probation? No data recorded Contacted To Inform of Risk of Harm To Self or Others: Other: Comment (NA)    Does Patient Present under Involuntary Commitment? No  IVC Papers Initial File Date: No data recorded  [place] of Residence: [place]   Patient Currently Receiving the Following Services: Medication Management   Determination of Need: Routine (7 days)   Options For Referral: Outpatient Therapy     CCA Biopsychosocial Patient Reported Schizophrenia/Schizoaffective Diagnosis in Past: No   Strengths: Pt has insight, regular providers and is willing to participate in treatment   Mental Health Symptoms Depression:   Change in energy/activity   Duration of Depressive symptoms:  Duration of Depressive Symptoms: Greater than two weeks   Mania:   None   Anxiety:    Worrying; Difficulty concentrating (Panic attacks, sweating.)   Psychosis:   None   Duration of Psychotic symptoms:    Trauma:   None   Obsessions:   Good insight; Intrusive/time consuming; Attempts to suppress/neutralize   Compulsions:   "Driven" to perform behaviors/acts; Disrupts with routine/functioning; Good insight; Intrusive/time consuming   Inattention:   None   Hyperactivity/Impulsivity:   N/A   Oppositional/Defiant Behaviors:   None   Emotional Irregularity:    None   Other Mood/Personality Symptoms:  No data recorded   Mental Status Exam Appearance and self-care  Stature:   Average   Weight:   Average weight   Clothing:   Casual   Grooming:   Neglected   Cosmetic use:   None   Posture/gait:   Normal   Motor activity:   Repetitive   Sensorium  Attention:   Distractible; Persistent   Concentration:   Anxiety interferes   Orientation:   X5   Recall/memory:   Normal   Affect and Mood  Affect:   Anxious; Depressed   Mood:   Anxious; Depressed   Relating  Eye contact:   Normal   Facial expression:   Anxious   Attitude toward examiner:   Cooperative   Thought and Language  Speech flow:  Normal   Thought content:   Appropriate to Mood and Circumstances   Preoccupation:   Obsessions (Anxiety, OCD.)   Hallucinations:   None   Organization:  No data recorded  [employer] of Knowledge:   Good; Average   Intelligence:   Average   Abstraction:   Functional   Judgement:   Fair   Reality Testing:  No data recorded  Insight:   Fair   Decision  Making:   Normal   Social Functioning  Social Maturity:  No data recorded  Social Judgement:   Normal   Stress  Stressors:   Housing; Transitions; Work; Surveyor, quantity (OCD, anxiety, depression/sadness, living situation, on LOA at work.)   Coping Ability:   Human resources officer Deficits:   Activities of daily living; Scientist, physiological; Self-care   Supports:   Family     Religion: Religion/Spirituality Are You A Religious Person?: Yes  Leisure/Recreation: Leisure / Recreation Do You Have Hobbies?: No  Exercise/Diet: Exercise/Diet Do You Exercise?: No Do You Follow a Special Diet?: No Do You Have Any Trouble Sleeping?: No   CCA Employment/Education Employment/Work Situation: Employment / Work Situation Employment Situation: Employed (P has been on a LOA for 2-3 months from his job at [employer].) Work  Stressors: Pt states hours are long Patient's Job has Been Impacted by Current Illness: Yes Has Patient ever Been in Equities trader?: No  Education: Education Last Grade Completed: 12 Did You Product manager?: Yes Did You Have An Individualized Education Program (IIEP): No Did You Have Any Difficulty At [employer]?: No   CCA Family/Childhood History Family and Relationship History: Family history Marital status: Single Does patient have children?: No  Childhood History:  Childhood History By whom was/is the patient raised?: Mother Did patient suffer any verbal/emotional/physical/sexual abuse as a child?: No Has patient ever been sexually abused/assaulted/raped as an adolescent or adult?: No Witnessed domestic violence?: No Has patient been affected by domestic violence as an adult?: No (NA)  Child/Adolescent Assessment:     CCA Substance Use Alcohol/Drug Use: Alcohol / Drug Use Pain Medications: See MAR Prescriptions: See MAR Over the Counter: See MAR History of alcohol / drug use?: No history of alcohol / drug abuse (Pt denies, use.)                         ASAM's:  Six Dimensions of Multidimensional Assessment  Dimension 1:  Acute Intoxication and/or Withdrawal Potential:      Dimension 2:  Biomedical Conditions and Complications:      Dimension 3:  Emotional, Behavioral, or Cognitive Conditions and Complications:     Dimension 4:  Readiness to Change:     Dimension 5:  Relapse, Continued use, or Continued Problem Potential:     Dimension 6:  Recovery/Living Environment:     ASAM Severity Score:    ASAM Recommended Level of Treatment:     Substance use Disorder (SUD)    Recommendations for Services/Supports/Treatments: Recommendations for Services/Supports/Treatments Recommendations For Services/Supports/Treatments: Partial Hospitalization (Pt is recommended PHP due to increased disruption to functioning and depressive symptoms - need for  stabilization)  Discharge Disposition:    DSM5 Diagnoses: There are no problems to display for this patient.    Referrals to Alternative Service(s): Referred to Alternative Service(s):   Place:   Date:   Time:    Referred to Alternative Service(s):   Place:   Date:   Time:    Referred to Alternative Service(s):   Place:   Date:   Time:    Referred to Alternative Service(s):   Place:   Date:   Time:     Jared Cannon, LCAS

## 2021-02-06 NOTE — ED Notes (Signed)
Discharge instructions provided and Pt stated understanding. Pt alert, orient and ambulatory prior to d/c from facility. Pt escorted to the front lobby. Safety maintained.

## 2021-02-06 NOTE — Discharge Instructions (Addendum)
Please take your Xanax 0.5mg  Morning, noon and night as prescribed until you see your outpatient psychiatrist. Please follow up with your therapist.

## 2021-02-20 ENCOUNTER — Ambulatory Visit (HOSPITAL_COMMUNITY)
Admission: RE | Admit: 2021-02-20 | Discharge: 2021-02-20 | Disposition: A | Discharge status: Home / Self Care | Payer: 59 | Attending: Psychiatry | Admitting: Psychiatry

## 2021-02-20 DIAGNOSIS — Z1330 Encounter for screening examination for mental health and behavioral disorders, unspecified: Secondary | ICD-10-CM | POA: Insufficient documentation

## 2021-02-20 NOTE — BH Assessment (Addendum)
Jared Cannon is a 50 year old male who presents voluntary and unaccompanied to [facility]. Patient reports ongoing intrusive thoughts for the last week associated with H/I towards his mother that he feels is related to current medications. Patient has a history of OCD and currently receives OP services from [facility] MD who assist with medication management. Patient denies any S/I, H/I or AVH.  Patient states he would never act on any thoughts to "harm anyone." Patient states he resides with his mother and has frequent verbal altercations with her "over everything." Patient denies any plan or intent and denies any previous history of violence. Patient denies any S/I. Patient reports a ongoing history significant for OCD and receives OP services from [facility] MD who assists with medication management. Patient this date is requesting resources to other area providers to "get a second opinion."    Jared Cannon writes on evaluation: Jared Cannon is a 50 y.o. male is seen face to face by this provider with TTS counselor as voluntary walk-in at [facility] accompanied by no one. Patient is siting in exam room calmly and cooperating with interview. History of OCD, depression, and anxiety. Patient reports that his nerves are short and he has no patience lately. He feels like something is wrong with his medications and that he may have serotonin syndrome. When asked what symptoms he is having to make him think he is having serotonin syndrome he reports that he is sweating, he also reports that his mother keeps the a/c at 76 degrees and that he doesn't have an a/c in his car. His skin appears warm and dry to this provider, however, he points to areas where he has sweat on his t-shirt. His vital signs are within normal limits. He reports he had one episode of diarrhea today.    Denies suicidal ideation, no intent, no plan, "I won't do it", but he reports that while awaiting this interview, he had a thought that he would take the sting from his  pants and hang himself. Denies homicidal ideation, however, he describes the relationship with his mother as dysfunctional and that he needs to find a new place to live. Denies auditory and visual hallucinations.    Reports residential stay at [facility] for his OCD. Sees Dr. Donell Beers for medication management, last saw 1-2 weeks ago. Sees [facility], therapist, has appointment 8/30 at 1000. Last seen at [facility] on 8/15 for similar. Denies substance and alcohol use. Sleeps 8-9 hours per night, appetite normal. Lives with mother, who he describes as dysfunctional and wants to move out. Works part-time with [employer].    Insists he may have serotonin syndrome even though this provider pointed out that his vital signs are normal and he does not appear to be having a medical emergency. His only symptom is sweating, but does not appear diaphoretic. No obvious tremors or rigidity.    Compliant with current medications that are prescribed by psychiatry. He reports his risperidone dose was increased 2 weeks ago. He is reporting increasing in "getting upset" and not responding appropriately to people when he gets upset. Reports he feels like he is having a crisis when he is upset. Next appointment with psychiatry is in October.   Patient to be discharged and will be provided with resources.

## 2021-02-20 NOTE — H&P (Signed)
Behavioral Health Medical Screening Exam  Jared Cannon is a 50 y.o. male is seen face to face by this provider with TTS counselor as voluntary walk-in at [facility] accompanied by no one. Patient is siting in exam room calmly and cooperating with interview. History of OCD, depression, and anxiety. Patient reports that his nerves are short and he has no patience lately. He feels like something is wrong with his medications and that he may have serotonin syndrome. When asked what symptoms he is having to make him think he is having serotonin syndrome he reports that he is sweating, he also reports that his mother keeps the a/c at 76 degrees and that he doesn't have an a/c in his car. His skin appears warm and dry to this provider, however, he points to areas where he has sweat on his t-shirt. His vital signs are within normal limits. He reports he had one episode of diarrhea today.   Denies suicidal ideation, no intent, no plan, "I won't do it", but he reports that while awaiting this interview, he had a thought that he would take the sting from his pants and hang himself. Denies homicidal ideation, however, he describes the relationship with his mother as dysfunctional and that he needs to find a new place to live. Denies auditory and visual hallucinations.   Reports residential stay at [facility] for his OCD. Sees Dr. Donell Beers for medication management, last saw 1-2 weeks ago. Sees [facility], therapist, has appointment 8/30 at 1000. Last seen at [facility] on 8/15 for similar. Denies substance and alcohol use. Sleeps 8-9 hours per night, appetite normal. Lives with mother, who he describes as dysfunctional and wants to move out. Works part-time with [employer].   Insists he may have serotonin syndrome even though this provider pointed out that his vital signs are normal and he does not appear to be having a medical emergency. His only symptom is sweating, but does not appear diaphoretic. No obvious tremors or rigidity.    Compliant with current medications that are prescribed by psychiatry. He reports his risperidone dose was increased 2 weeks ago. He is reporting increasing in "getting upset" and not responding appropriately to people when he gets upset. Reports he feels like he is having a crisis when he is upset. Next appointment with psychiatry is in October.   Total Time spent with patient: 45 minutes  Psychiatric Specialty Exam:  Presentation  General Appearance: Appropriate for Environment  Eye Contact:Good  Speech:Clear and Coherent  Speech Volume:Normal  Handedness: No data recorded  Mood and Affect  Mood:Anxious  Affect:Appropriate   Thought Process  Thought Processes:Coherent; Goal Directed  Descriptions of Associations:Intact  Orientation:Full (Time, Place and Person)  Thought Content:Logical  History of Schizophrenia/Schizoaffective disorder:No  Duration of Psychotic Symptoms:No data recorded Hallucinations:Hallucinations: None Ideas of Reference:None  Suicidal Thoughts:Suicidal Thoughts: No Homicidal Thoughts:Homicidal Thoughts: No  Sensorium  Memory:Immediate Good; Recent Good; Remote Good  Judgment:Good  Insight:Good   Executive Functions  Concentration:Good  Attention Span:Good  Recall:Good  Fund of Knowledge:Good  Language:Good   Psychomotor Activity  Psychomotor Activity: Psychomotor Activity: Normal  Assets  Assets:Communication Skills; Desire for Improvement; Financial Resources/Insurance; Housing; Leisure Time; Physical Health; Resilience; Social Support; Vocational/Educational   Sleep  Sleep: Sleep: Good Number of Hours of Sleep: 9   Physical Exam: Physical Exam Vitals reviewed.  Constitutional:      General: He is not in acute distress.    Appearance: He is not ill-appearing or diaphoretic (reports sweating).  Cardiovascular:  Rate and Rhythm: Normal rate.  Pulmonary:     Effort: Pulmonary effort is normal.     Breath  sounds: Normal breath sounds.  Musculoskeletal:        General: Normal range of motion.  Skin:    General: Skin is warm and dry.  Neurological:     Mental Status: He is alert and oriented to person, place, and time.  Psychiatric:        Attention and Perception: Attention normal.        Mood and Affect: Mood is anxious.        Speech: Speech normal.        Behavior: Behavior normal. Behavior is cooperative.        Thought Content: Thought content is not paranoid or delusional. Thought content does not include homicidal or suicidal ideation. Thought content does not include homicidal or suicidal plan.   Review of Systems  Constitutional:  Negative for chills and fever.  Gastrointestinal:  Positive for diarrhea (one episode this morning). Negative for abdominal pain, nausea and vomiting.  Neurological:  Negative for dizziness, seizures, loss of consciousness, weakness and headaches.  Psychiatric/Behavioral:  Positive for suicidal ideas (reports he had passing thought of suicide while waiting for this evaluation). Negative for depression, hallucinations, memory loss and substance abuse. The patient is nervous/anxious. The patient does not have insomnia.   Blood pressure 127/86, pulse 96, temperature 98.5 F (36.9 C), temperature source Oral, resp. rate 18. There is no height or weight on file to calculate BMI.  Musculoskeletal: Strength & Muscle Tone: within normal limits Gait & Station: normal Patient leans: N/A   Recommendations:  Based on my evaluation the patient does not appear to have an emergency medical condition. Safe for outpatient psychiatric treatment with resources provided. Patient has psychiatrist and wishes to get second opinion for medication management. List of local providers giver per TTS counselor. Does not meet criteria for inpatient psychiatric treatment. Agrees with plan to follow-up with a different psychiatrist for second opinion. Safety planning reviewed in  detail.   Novella Olive, NP 02/20/2021, 5:55 PM

## 2021-04-12 ENCOUNTER — Other Ambulatory Visit: Payer: Self-pay | Admitting: Family Medicine

## 2021-04-12 DIAGNOSIS — M7989 Other specified soft tissue disorders: Secondary | ICD-10-CM

## 2021-04-26 ENCOUNTER — Other Ambulatory Visit: Payer: Self-pay

## 2021-04-27 ENCOUNTER — Ambulatory Visit
Admission: RE | Admit: 2021-04-27 | Discharge: 2021-04-27 | Disposition: A | Discharge status: Home / Self Care | Payer: Managed Care, Other (non HMO) | Source: Ambulatory Visit | Attending: Family Medicine | Admitting: Family Medicine

## 2021-04-27 DIAGNOSIS — M7989 Other specified soft tissue disorders: Secondary | ICD-10-CM

## 2021-05-15 ENCOUNTER — Other Ambulatory Visit: Payer: Self-pay

## 2022-03-14 IMAGING — US US PELVIS LIMITED
2 of 3 series · 14 of 18 positions shown · non-contrast
Comparison: None.

CLINICAL DATA: Palpable lumps in the anterior abdominal wall

EXAM:
US PELVIS LIMITED

[Series 1: us pelvis limited · 0.06mm/px · 15 acquisitions, 12 frames shown (1 of 2)]
[im 1/15]
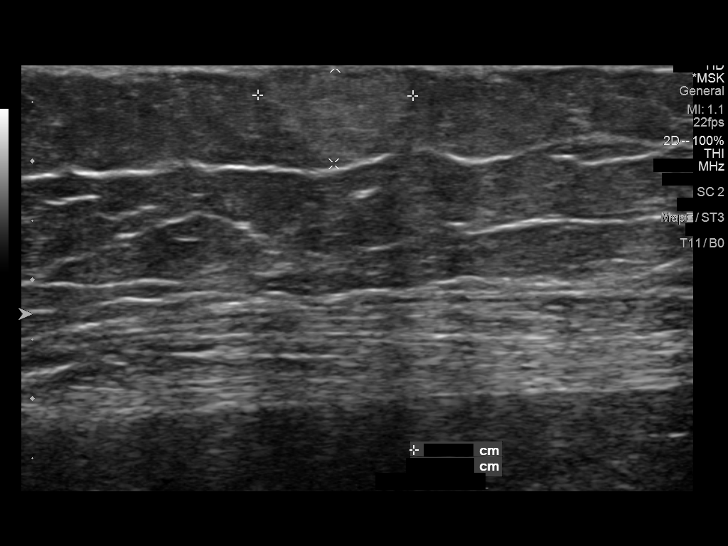
[im 2/15]
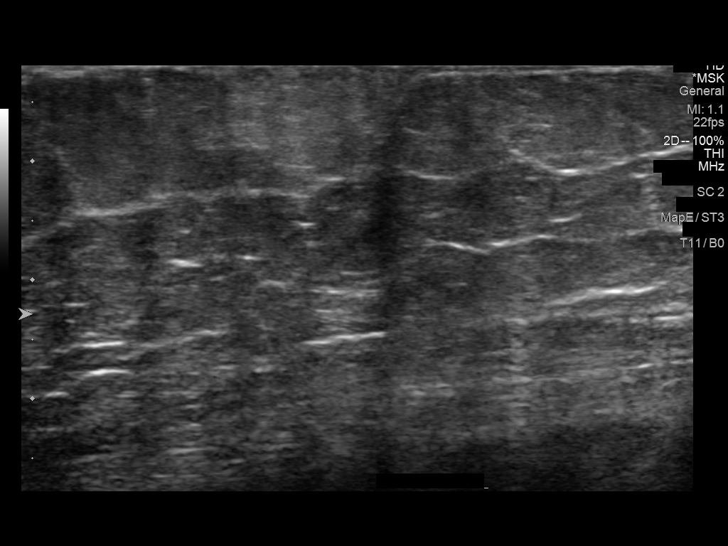
[im 4/15]
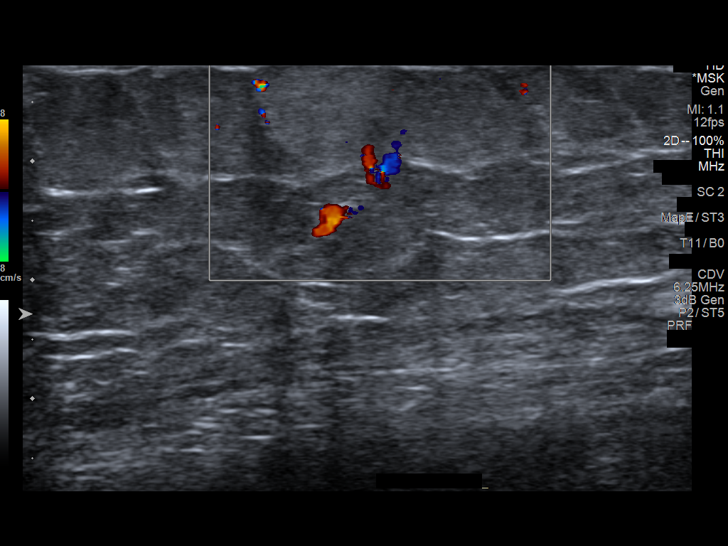
[im 5/15]
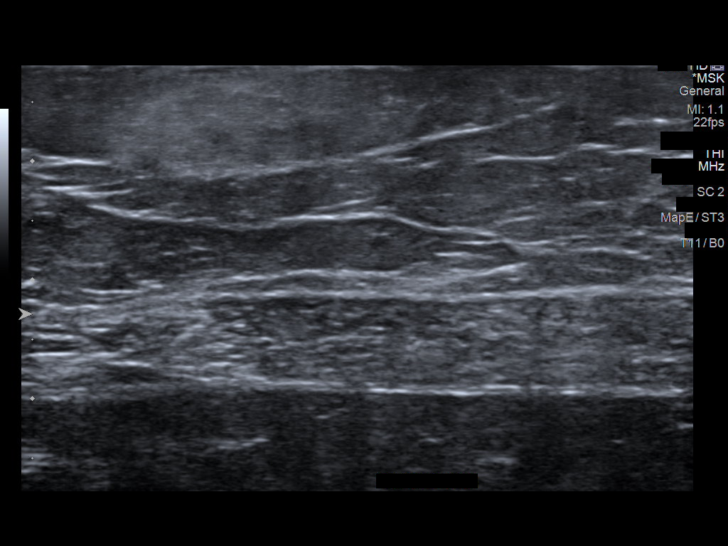
[im 6/15]
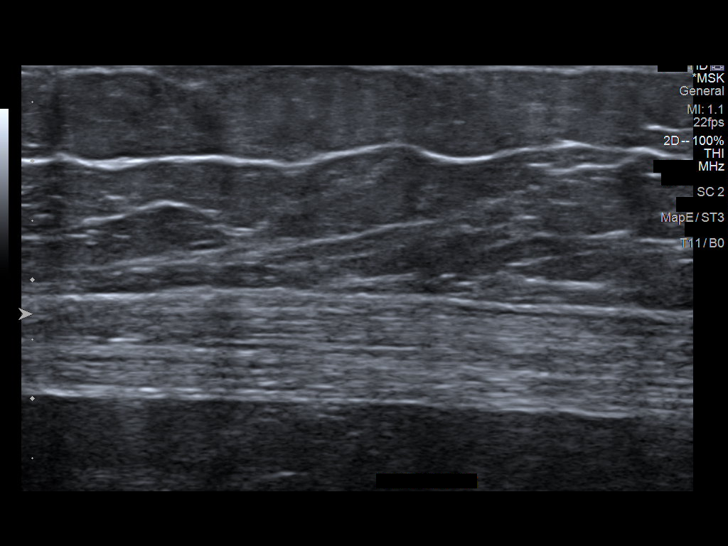
[im 8/15]
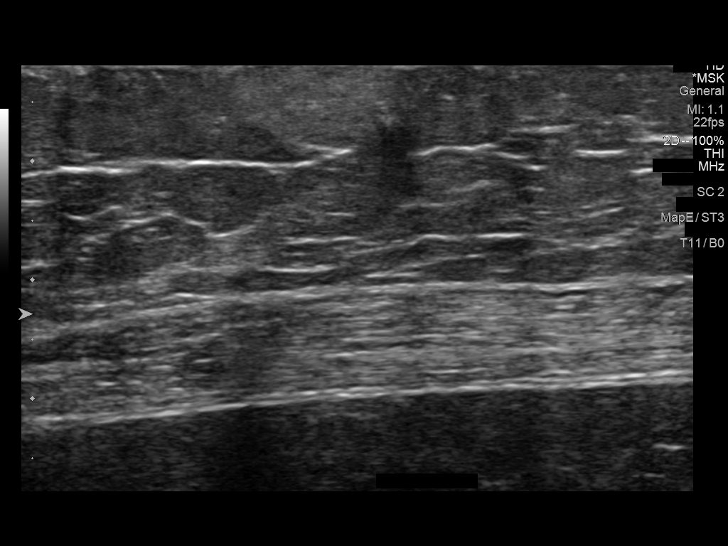
[im 9/15]
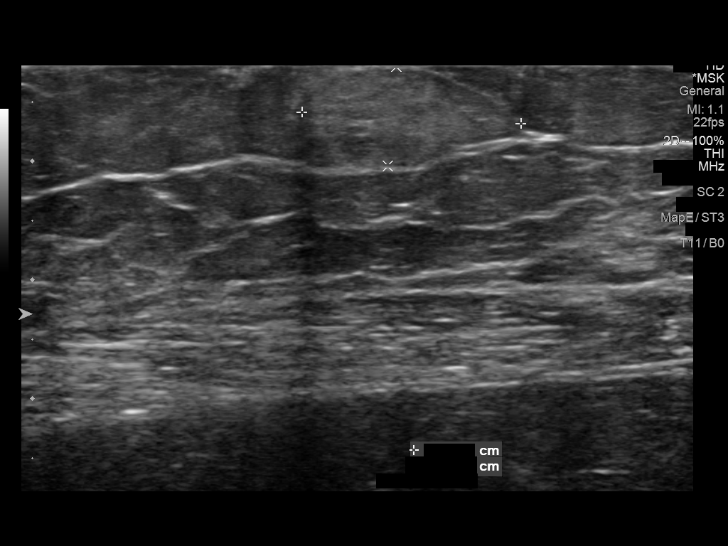
[im 10/15]
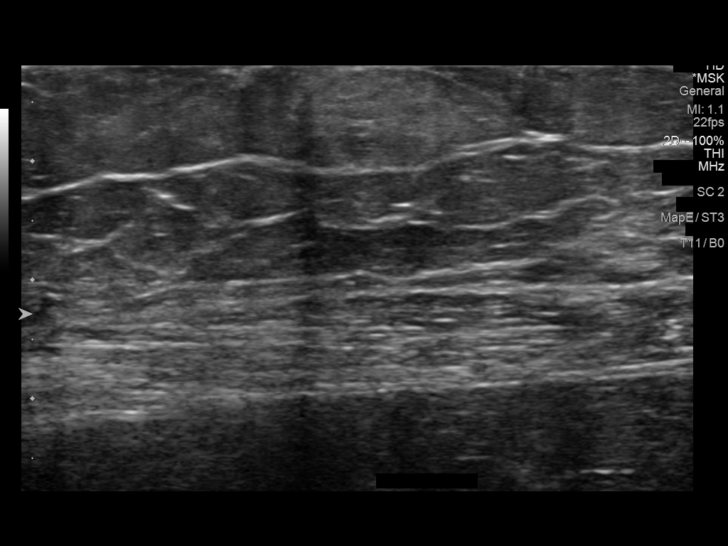
[im 11/15]
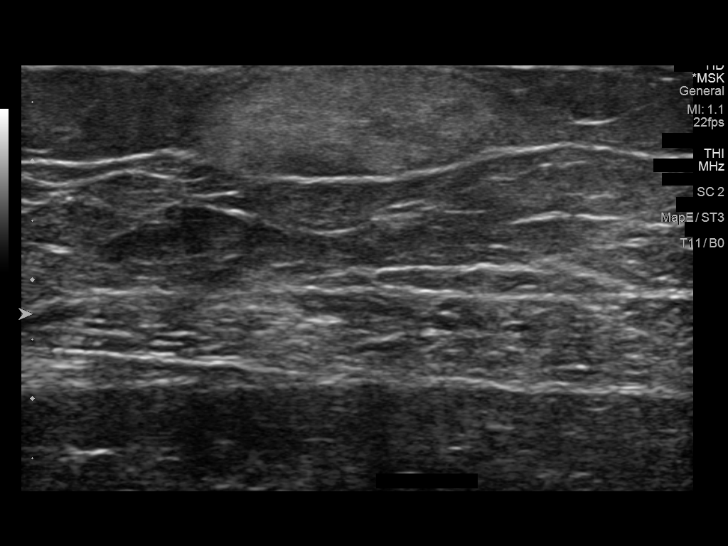
[im 13/15]
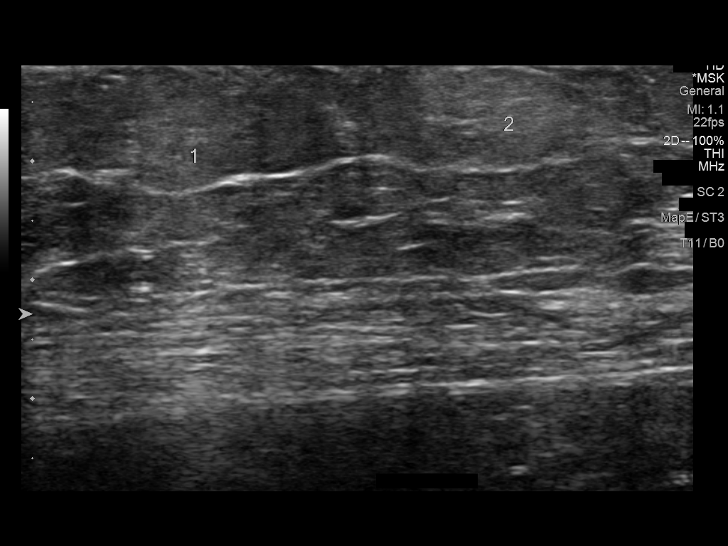
[im 14/15]
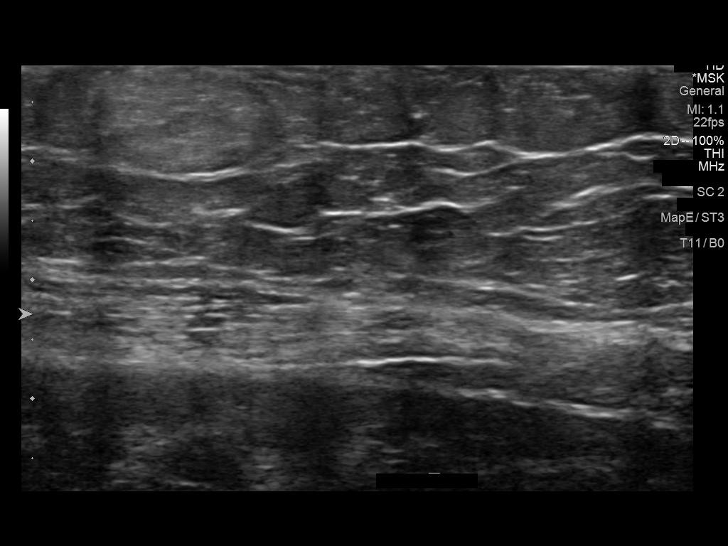
[im 15/15]
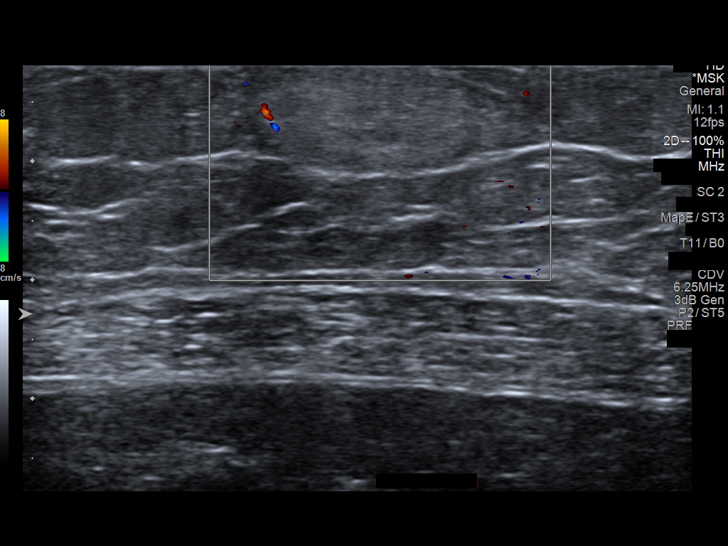

[Series 3: us pelvis limited · 2 of 2 slices shown (2 of 2)]
[im 1/2]
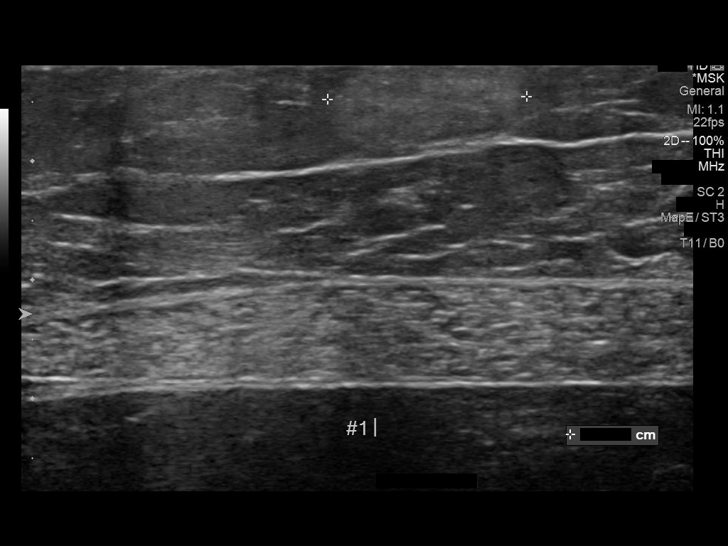
[im 2/2]
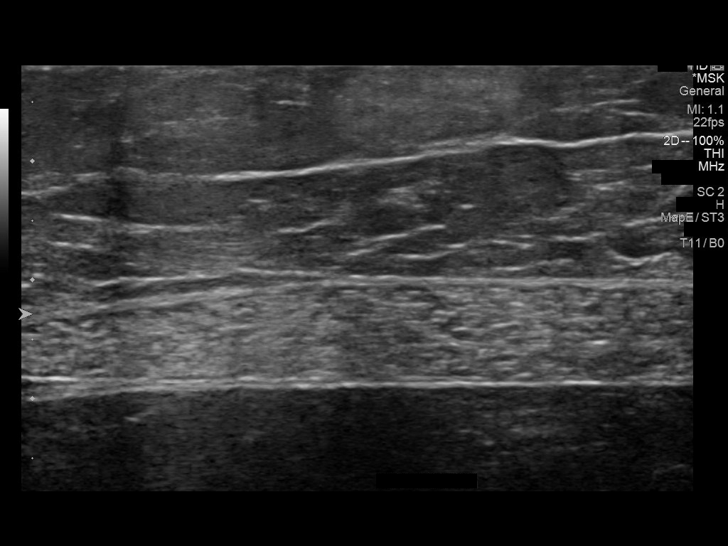

[14 of 18 positions shown; findings below may reference images not displayed]

FINDINGS: There is 1.9 x 0.8 x 3 cm hyperechoic focus in subcutaneous plane in
the right anterior abdominal wall. There is another 1.3 x 0.8 x
cm hyperechoic area in the subcutaneous plane in right side of
abdomen. There are no abnormal loculated fluid collections or
irregular soft tissue masses in the area of patient's symptoms.
IMPRESSION: There are 2 hyperechoic foci in subcutaneous plane in the anterior
abdominal wall on the right side. This finding suggests possible
lipomas. Comparison with previous studies if available would be of
help to assess stability. If no such studies are available,
short-term follow-up sonogram may be considered to assess stability.

## 2022-09-14 ENCOUNTER — Ambulatory Visit (HOSPITAL_COMMUNITY)
Admission: EM | Admit: 2022-09-14 | Discharge: 2022-09-14 | Disposition: A | Discharge status: Home / Self Care | Payer: Medicaid Other | Attending: Urology | Admitting: Urology

## 2022-09-14 DIAGNOSIS — F429 Obsessive-compulsive disorder, unspecified: Secondary | ICD-10-CM | POA: Insufficient documentation

## 2022-09-14 DIAGNOSIS — F32A Depression, unspecified: Secondary | ICD-10-CM | POA: Insufficient documentation

## 2022-09-14 DIAGNOSIS — F419 Anxiety disorder, unspecified: Secondary | ICD-10-CM | POA: Insufficient documentation

## 2022-09-14 DIAGNOSIS — F422 Mixed obsessional thoughts and acts: Secondary | ICD-10-CM

## 2022-09-14 DIAGNOSIS — Z596 Low income: Secondary | ICD-10-CM | POA: Insufficient documentation

## 2022-09-14 DIAGNOSIS — R45851 Suicidal ideations: Secondary | ICD-10-CM | POA: Insufficient documentation

## 2022-09-14 DIAGNOSIS — Z5986 Financial insecurity: Secondary | ICD-10-CM | POA: Insufficient documentation

## 2022-09-14 NOTE — ED Triage Notes (Signed)
Pt presents to [facility] voluntarily due to worsening OCD symptoms and anger outbursts. He reports saw his psychiatrist yesterday, and he decided to stop one of his medications. Pt states he became overwhelmed yesterday after stepping into dog feces and had passive thoughts about SI. Pt reports an argument with his mother yesterday, which resulted in him "blowing up" at her verbally, and he felt bad about his actions. Pt reports interest in therapy resources. Pt denies HI and AVH.

## 2022-09-14 NOTE — ED Provider Notes (Signed)
[facility] Medical Screening Exam  Patient Name: Jared Cannon MRN: [id number] Date of Evaluation: 09/14/22 Chief Complaint:  "I need to talk to someone about my anxiety" Diagnosis:  Final diagnoses:  Mixed obsessional thoughts and acts    History of Present illness: Jared Cannon is a 52 y.o. male with psychiatric history of depression and OCD. Patient presented voluntarily to [facility] reporting worsening OCD symptoms, irritability, and anxiety.  Patient was evaluated face-to-face and his chart was reviewed by this nurse practitioner.  Patient reported that he is under a lot of stress and feeling overwhelmed.  He reported he is struggling financially and this is causing him increased stress. He says he is in the process of applying for disability due to his OCD symptoms.  He says due to financial reasons, he is currently residing with his mother. He reports his mother's has dogs and they urinate and defecate on the carpet in the home. He says the home is unhealthy and has lots of clutter. He report stepping in dog feces, becoming frustrated, and having an anger outburst yesterday. He report verbal altercation with his mother after stepping the dog feces. Patient reports "I just blown up and yelled at her; I said things that I now regret." Patient reports threatening to call "the city to have the home condemned due to the clutter." He says he regrets threatening to call the city on his mother and making her to feel bad about her living condition. He report having suicidal thought during the altercation. He denies current suicidal ideation/plan/intent. He denies prior suicidal attempt. He denies homicidal ideation, hallucination, paranoia, and substance abuse. He says he has a psychiatrist Dr. Casimiro Needle. He says he is presribed Clomipramine 75mg /day (decreased from 150 to 75mg  a few days ago due to high blood level), prozac 60mg /day, Provigil 200mg /day, modafinil 200mg /day, lorazepam 1mg /BID,  No  current therapist. He says he needs a therapist at specializes in [place].  Patient is able to contract for safety. He denies safety concerns   Patient is anxious, alert and oriented x 4.  Patient is speaking in a clear tone of voice at a moderate rate with good eye contact.  Patient's mood is anxious with a congruent affect. His thought process is coherent. Patient did not appear to be responding to any internal/external stimuli or experiencing any delusional thought content.  No signs of psychosis, mania, or preoccupation noted during assessment.  Support and encouragement given to patient. Discussed available outpatient resources/therapy services with patient. Patient says he is applying for medicaid and disability. Information for Open Access provided to patient.    [place] [facility] from 09/14/2022 in [facility] ED from 02/06/2021 in [place] Low Risk No Risk       Psychiatric Specialty Exam  Presentation  General Appearance:Appropriate for Environment  Eye Contact:Good  Speech:Clear and Coherent  Speech Volume:Normal  Handedness:Right   Mood and Affect  Mood: Anxious  Affect: Congruent   Thought Process  Thought Processes: Coherent  Descriptions of Associations:Intact  Orientation:Full (Time, Place and Person)  Thought Content:WDL  Diagnosis of Schizophrenia or Schizoaffective disorder in past: No data recorded  Hallucinations:None  Ideas of Reference:None  Suicidal Thoughts:No  Homicidal Thoughts:No   Sensorium  Memory: Immediate Good; Recent Fair; Remote Fair  Judgment: Fair  Insight: Good   Executive Functions  Concentration: Fair  Attention Span: Fair  Recall: Good  Fund of Knowledge: Good  Language: Good  Psychomotor Activity  Psychomotor Activity: Normal   Assets  Assets: Communication Skills; Desire for Improvement; Physical Health;  Transportation   Sleep  Sleep: Good  Number of hours:  8   Physical Exam: Physical Exam Vitals and nursing note reviewed.  Constitutional:      General: He is not in acute distress.    Appearance: He is well-developed.  HENT:     Head: Normocephalic and atraumatic.  Eyes:     Conjunctiva/sclera: Conjunctivae normal.  Cardiovascular:     Rate and Rhythm: Tachycardia present.  Pulmonary:     Effort: Pulmonary effort is normal. No respiratory distress.     Breath sounds: Normal breath sounds.  Abdominal:     Palpations: Abdomen is soft.     Tenderness: There is no abdominal tenderness.  Musculoskeletal:        General: No swelling.     Cervical back: Normal range of motion and neck supple.  Skin:    General: Skin is warm and dry.  Neurological:     Mental Status: He is alert and oriented to person, place, and time.  Psychiatric:        Attention and Perception: Attention and perception normal. He does not perceive auditory or visual hallucinations.        Mood and Affect: Mood is anxious.        Speech: Speech normal.        Behavior: Behavior normal. Behavior is cooperative.        Thought Content: Thought content normal.    Review of Systems  Constitutional: Negative.   HENT: Negative.    Eyes: Negative.   Respiratory: Negative.    Cardiovascular: Negative.   Gastrointestinal: Negative.   Genitourinary: Negative.   Musculoskeletal: Negative.   Skin: Negative.   Neurological: Negative.   Endo/Heme/Allergies: Negative.   Psychiatric/Behavioral:  Positive for depression. Negative for hallucinations, substance abuse and suicidal ideas. The patient is nervous/anxious.    Blood pressure (!) 153/96, pulse (!) 108, resp. rate 16, SpO2 99 %. There is no height or weight on file to calculate BMI.  Musculoskeletal: Strength & Muscle Tone: within normal limits Gait & Station: normal Patient leans: Right   [facility] Discharge Disposition for Follow up and  Recommendations: Based on my evaluation the patient does not appear to have an emergency medical condition and can be discharged with resources and follow up care in outpatient services for Medication Management and Individual Therapy   Ophelia Shoulder, NP 09/14/2022, 10:44 PM

## 2022-09-14 NOTE — Discharge Instructions (Addendum)

## 2023-08-09 DIAGNOSIS — R7303 Prediabetes: Secondary | ICD-10-CM | POA: Diagnosis not present

## 2023-08-09 DIAGNOSIS — R079 Chest pain, unspecified: Secondary | ICD-10-CM | POA: Diagnosis not present

## 2023-08-09 DIAGNOSIS — F411 Generalized anxiety disorder: Secondary | ICD-10-CM | POA: Diagnosis not present

## 2023-08-09 DIAGNOSIS — Z23 Encounter for immunization: Secondary | ICD-10-CM | POA: Diagnosis not present

## 2023-08-13 ENCOUNTER — Other Ambulatory Visit: Payer: Self-pay | Admitting: Family Medicine

## 2023-08-13 DIAGNOSIS — R7989 Other specified abnormal findings of blood chemistry: Secondary | ICD-10-CM

## 2023-08-21 ENCOUNTER — Ambulatory Visit
Admission: RE | Admit: 2023-08-21 | Discharge: 2023-08-21 | Disposition: A | Discharge status: Home / Self Care | Payer: 59 | Source: Ambulatory Visit | Attending: Family Medicine | Admitting: Family Medicine

## 2023-08-21 DIAGNOSIS — R7989 Other specified abnormal findings of blood chemistry: Secondary | ICD-10-CM

## 2023-08-21 DIAGNOSIS — K760 Fatty (change of) liver, not elsewhere classified: Secondary | ICD-10-CM | POA: Diagnosis not present

## 2023-08-27 DIAGNOSIS — F422 Mixed obsessional thoughts and acts: Secondary | ICD-10-CM | POA: Diagnosis not present

## 2023-08-27 DIAGNOSIS — F411 Generalized anxiety disorder: Secondary | ICD-10-CM | POA: Diagnosis not present

## 2023-09-03 DIAGNOSIS — F411 Generalized anxiety disorder: Secondary | ICD-10-CM | POA: Diagnosis not present

## 2023-09-03 DIAGNOSIS — F422 Mixed obsessional thoughts and acts: Secondary | ICD-10-CM | POA: Diagnosis not present

## 2023-09-06 DIAGNOSIS — Z125 Encounter for screening for malignant neoplasm of prostate: Secondary | ICD-10-CM | POA: Diagnosis not present

## 2023-09-06 DIAGNOSIS — Z0000 Encounter for general adult medical examination without abnormal findings: Secondary | ICD-10-CM | POA: Diagnosis not present

## 2023-09-06 DIAGNOSIS — I10 Essential (primary) hypertension: Secondary | ICD-10-CM | POA: Diagnosis not present

## 2023-09-06 DIAGNOSIS — E538 Deficiency of other specified B group vitamins: Secondary | ICD-10-CM | POA: Diagnosis not present

## 2023-09-06 DIAGNOSIS — R7303 Prediabetes: Secondary | ICD-10-CM | POA: Diagnosis not present

## 2023-09-06 DIAGNOSIS — K760 Fatty (change of) liver, not elsewhere classified: Secondary | ICD-10-CM | POA: Diagnosis not present

## 2023-09-06 DIAGNOSIS — F429 Obsessive-compulsive disorder, unspecified: Secondary | ICD-10-CM | POA: Diagnosis not present

## 2023-09-09 DIAGNOSIS — F411 Generalized anxiety disorder: Secondary | ICD-10-CM | POA: Diagnosis not present

## 2023-09-09 DIAGNOSIS — F422 Mixed obsessional thoughts and acts: Secondary | ICD-10-CM | POA: Diagnosis not present

## 2023-09-10 DIAGNOSIS — F422 Mixed obsessional thoughts and acts: Secondary | ICD-10-CM | POA: Diagnosis not present

## 2023-09-10 DIAGNOSIS — F411 Generalized anxiety disorder: Secondary | ICD-10-CM | POA: Diagnosis not present

## 2023-09-17 DIAGNOSIS — F411 Generalized anxiety disorder: Secondary | ICD-10-CM | POA: Diagnosis not present

## 2023-09-17 DIAGNOSIS — F422 Mixed obsessional thoughts and acts: Secondary | ICD-10-CM | POA: Diagnosis not present

## 2023-09-18 DIAGNOSIS — Z13220 Encounter for screening for lipoid disorders: Secondary | ICD-10-CM | POA: Diagnosis not present

## 2023-09-18 DIAGNOSIS — Z0000 Encounter for general adult medical examination without abnormal findings: Secondary | ICD-10-CM | POA: Diagnosis not present

## 2023-09-18 DIAGNOSIS — K760 Fatty (change of) liver, not elsewhere classified: Secondary | ICD-10-CM | POA: Diagnosis not present

## 2023-09-18 DIAGNOSIS — E538 Deficiency of other specified B group vitamins: Secondary | ICD-10-CM | POA: Diagnosis not present

## 2023-09-18 DIAGNOSIS — Z125 Encounter for screening for malignant neoplasm of prostate: Secondary | ICD-10-CM | POA: Diagnosis not present

## 2023-09-24 DIAGNOSIS — F411 Generalized anxiety disorder: Secondary | ICD-10-CM | POA: Diagnosis not present

## 2023-09-24 DIAGNOSIS — F422 Mixed obsessional thoughts and acts: Secondary | ICD-10-CM | POA: Diagnosis not present

## 2023-09-27 DIAGNOSIS — F411 Generalized anxiety disorder: Secondary | ICD-10-CM | POA: Diagnosis not present

## 2023-09-27 DIAGNOSIS — F422 Mixed obsessional thoughts and acts: Secondary | ICD-10-CM | POA: Diagnosis not present

## 2023-10-01 DIAGNOSIS — F411 Generalized anxiety disorder: Secondary | ICD-10-CM | POA: Diagnosis not present

## 2023-10-01 DIAGNOSIS — F422 Mixed obsessional thoughts and acts: Secondary | ICD-10-CM | POA: Diagnosis not present

## 2023-10-03 DIAGNOSIS — F605 Obsessive-compulsive personality disorder: Secondary | ICD-10-CM | POA: Diagnosis not present

## 2023-10-09 DIAGNOSIS — F411 Generalized anxiety disorder: Secondary | ICD-10-CM | POA: Diagnosis not present

## 2023-10-09 DIAGNOSIS — F422 Mixed obsessional thoughts and acts: Secondary | ICD-10-CM | POA: Diagnosis not present

## 2023-10-15 DIAGNOSIS — F411 Generalized anxiety disorder: Secondary | ICD-10-CM | POA: Diagnosis not present

## 2023-10-15 DIAGNOSIS — F422 Mixed obsessional thoughts and acts: Secondary | ICD-10-CM | POA: Diagnosis not present

## 2023-10-22 DIAGNOSIS — F411 Generalized anxiety disorder: Secondary | ICD-10-CM | POA: Diagnosis not present

## 2023-10-22 DIAGNOSIS — F422 Mixed obsessional thoughts and acts: Secondary | ICD-10-CM | POA: Diagnosis not present

## 2023-10-29 DIAGNOSIS — F422 Mixed obsessional thoughts and acts: Secondary | ICD-10-CM | POA: Diagnosis not present

## 2023-10-29 DIAGNOSIS — F411 Generalized anxiety disorder: Secondary | ICD-10-CM | POA: Diagnosis not present

## 2023-11-04 DIAGNOSIS — F605 Obsessive-compulsive personality disorder: Secondary | ICD-10-CM | POA: Diagnosis not present

## 2023-11-07 DIAGNOSIS — F422 Mixed obsessional thoughts and acts: Secondary | ICD-10-CM | POA: Diagnosis not present

## 2023-11-07 DIAGNOSIS — F411 Generalized anxiety disorder: Secondary | ICD-10-CM | POA: Diagnosis not present

## 2023-11-09 DIAGNOSIS — F411 Generalized anxiety disorder: Secondary | ICD-10-CM | POA: Diagnosis not present

## 2023-11-09 DIAGNOSIS — F422 Mixed obsessional thoughts and acts: Secondary | ICD-10-CM | POA: Diagnosis not present

## 2023-12-11 ENCOUNTER — Other Ambulatory Visit: Payer: Self-pay | Admitting: Family Medicine

## 2023-12-11 DIAGNOSIS — R519 Headache, unspecified: Secondary | ICD-10-CM

## 2023-12-16 ENCOUNTER — Ambulatory Visit
Admission: RE | Admit: 2023-12-16 | Discharge: 2023-12-16 | Disposition: A | Discharge status: Home / Self Care | Source: Ambulatory Visit | Attending: Family Medicine | Admitting: Family Medicine

## 2023-12-16 DIAGNOSIS — R519 Headache, unspecified: Secondary | ICD-10-CM
